# Patient Record
Sex: Female | Born: 1965 | Race: Black or African American | Hispanic: No | Smoking: Never smoker
Health system: Southern US, Community
[De-identification: ages and names within clinical notes are randomized; demographics above are authoritative.]

## PROBLEM LIST (undated history)

## (undated) DIAGNOSIS — E785 Hyperlipidemia, unspecified: Secondary | ICD-10-CM

## (undated) DIAGNOSIS — I10 Essential (primary) hypertension: Secondary | ICD-10-CM

## (undated) HISTORY — PX: NO PAST SURGERIES: SHX2092

---

## 1998-01-14 ENCOUNTER — Other Ambulatory Visit: Admission: RE | Admit: 1998-01-14 | Discharge: 1998-01-14 | Payer: Self-pay | Admitting: Obstetrics and Gynecology

## 1998-04-18 ENCOUNTER — Inpatient Hospital Stay (HOSPITAL_COMMUNITY): Admission: AD | Admit: 1998-04-18 | Discharge: 1998-04-18 | Payer: Self-pay | Admitting: Obstetrics & Gynecology

## 1998-07-18 ENCOUNTER — Inpatient Hospital Stay (HOSPITAL_COMMUNITY): Admission: AD | Admit: 1998-07-18 | Discharge: 1998-07-18 | Payer: Self-pay | Admitting: Obstetrics and Gynecology

## 1998-07-18 ENCOUNTER — Encounter: Payer: Self-pay | Admitting: Obstetrics & Gynecology

## 1998-07-21 ENCOUNTER — Inpatient Hospital Stay (HOSPITAL_COMMUNITY): Admission: AD | Admit: 1998-07-21 | Discharge: 1998-07-25 | Payer: Self-pay | Admitting: Obstetrics & Gynecology

## 1999-01-05 ENCOUNTER — Other Ambulatory Visit: Admission: RE | Admit: 1999-01-05 | Discharge: 1999-01-05 | Payer: Self-pay | Admitting: *Deleted

## 2001-02-03 ENCOUNTER — Encounter: Payer: Self-pay | Admitting: *Deleted

## 2001-02-03 ENCOUNTER — Encounter: Admission: RE | Admit: 2001-02-03 | Discharge: 2001-02-03 | Payer: Self-pay | Admitting: *Deleted

## 2001-08-13 ENCOUNTER — Other Ambulatory Visit: Admission: RE | Admit: 2001-08-13 | Discharge: 2001-08-13 | Payer: Self-pay | Admitting: *Deleted

## 2001-08-26 ENCOUNTER — Ambulatory Visit (HOSPITAL_COMMUNITY): Admission: RE | Admit: 2001-08-26 | Discharge: 2001-08-26 | Payer: Self-pay | Admitting: *Deleted

## 2001-08-26 ENCOUNTER — Encounter: Payer: Self-pay | Admitting: *Deleted

## 2002-02-12 ENCOUNTER — Inpatient Hospital Stay (HOSPITAL_COMMUNITY): Admission: AD | Admit: 2002-02-12 | Discharge: 2002-02-14 | Payer: Self-pay | Admitting: *Deleted

## 2002-02-13 ENCOUNTER — Encounter (INDEPENDENT_AMBULATORY_CARE_PROVIDER_SITE_OTHER): Payer: Self-pay | Admitting: Specialist

## 2003-04-13 ENCOUNTER — Other Ambulatory Visit: Admission: RE | Admit: 2003-04-13 | Discharge: 2003-04-13 | Payer: Self-pay | Admitting: Obstetrics and Gynecology

## 2003-05-28 ENCOUNTER — Ambulatory Visit (HOSPITAL_COMMUNITY): Admission: RE | Admit: 2003-05-28 | Discharge: 2003-05-28 | Payer: Self-pay | Admitting: Obstetrics and Gynecology

## 2003-08-21 ENCOUNTER — Inpatient Hospital Stay (HOSPITAL_COMMUNITY): Admission: AD | Admit: 2003-08-21 | Discharge: 2003-08-21 | Payer: Self-pay | Admitting: Obstetrics and Gynecology

## 2003-09-30 ENCOUNTER — Inpatient Hospital Stay (HOSPITAL_COMMUNITY): Admission: AD | Admit: 2003-09-30 | Discharge: 2003-09-30 | Payer: Self-pay

## 2003-10-05 ENCOUNTER — Inpatient Hospital Stay (HOSPITAL_COMMUNITY): Admission: AD | Admit: 2003-10-05 | Discharge: 2003-10-07 | Payer: Self-pay | Admitting: Obstetrics and Gynecology

## 2003-10-05 ENCOUNTER — Encounter (INDEPENDENT_AMBULATORY_CARE_PROVIDER_SITE_OTHER): Payer: Self-pay | Admitting: Specialist

## 2004-01-13 ENCOUNTER — Encounter (HOSPITAL_COMMUNITY): Admission: RE | Admit: 2004-01-13 | Discharge: 2004-04-12 | Payer: Self-pay | Admitting: Cardiovascular Disease

## 2004-01-20 ENCOUNTER — Encounter: Admission: RE | Admit: 2004-01-20 | Discharge: 2004-01-20 | Payer: Self-pay | Admitting: Cardiovascular Disease

## 2004-02-01 ENCOUNTER — Encounter: Admission: RE | Admit: 2004-02-01 | Discharge: 2004-02-01 | Payer: Self-pay | Admitting: Cardiovascular Disease

## 2004-02-08 ENCOUNTER — Encounter: Admission: RE | Admit: 2004-02-08 | Discharge: 2004-02-08 | Payer: Self-pay | Admitting: Cardiovascular Disease

## 2004-02-29 ENCOUNTER — Ambulatory Visit (HOSPITAL_COMMUNITY): Admission: RE | Admit: 2004-02-29 | Discharge: 2004-02-29 | Payer: Self-pay | Admitting: General Surgery

## 2004-02-29 ENCOUNTER — Encounter (INDEPENDENT_AMBULATORY_CARE_PROVIDER_SITE_OTHER): Payer: Self-pay | Admitting: *Deleted

## 2004-05-15 ENCOUNTER — Encounter (INDEPENDENT_AMBULATORY_CARE_PROVIDER_SITE_OTHER): Payer: Self-pay | Admitting: *Deleted

## 2004-05-15 ENCOUNTER — Ambulatory Visit (HOSPITAL_COMMUNITY): Admission: RE | Admit: 2004-05-15 | Discharge: 2004-05-16 | Payer: Self-pay | Admitting: General Surgery

## 2005-03-19 ENCOUNTER — Ambulatory Visit (HOSPITAL_COMMUNITY): Admission: RE | Admit: 2005-03-19 | Discharge: 2005-03-19 | Payer: Self-pay | Admitting: *Deleted

## 2005-03-23 ENCOUNTER — Inpatient Hospital Stay (HOSPITAL_COMMUNITY): Admission: AD | Admit: 2005-03-23 | Discharge: 2005-03-23 | Payer: Self-pay | Admitting: *Deleted

## 2005-04-23 ENCOUNTER — Encounter: Admission: RE | Admit: 2005-04-23 | Discharge: 2005-04-23 | Payer: Self-pay | Admitting: Cardiovascular Disease

## 2006-07-20 IMAGING — CR DG CHEST 2V
2 series · 2 of 2 positions shown · non-contrast
Comparison: none

CLINICAL DATA: Thyroid nodule.  Weakness and tiredness.
 PA AND LATERAL CHEST 
 Heart size and vascularity are normal and the lungs are clear.  There is a very mild thoracolumbar scoliosis.  No significant bony abnormality. 
 IMPRESSION
 No significant abnormality.

[view not recorded (1 of 2)]
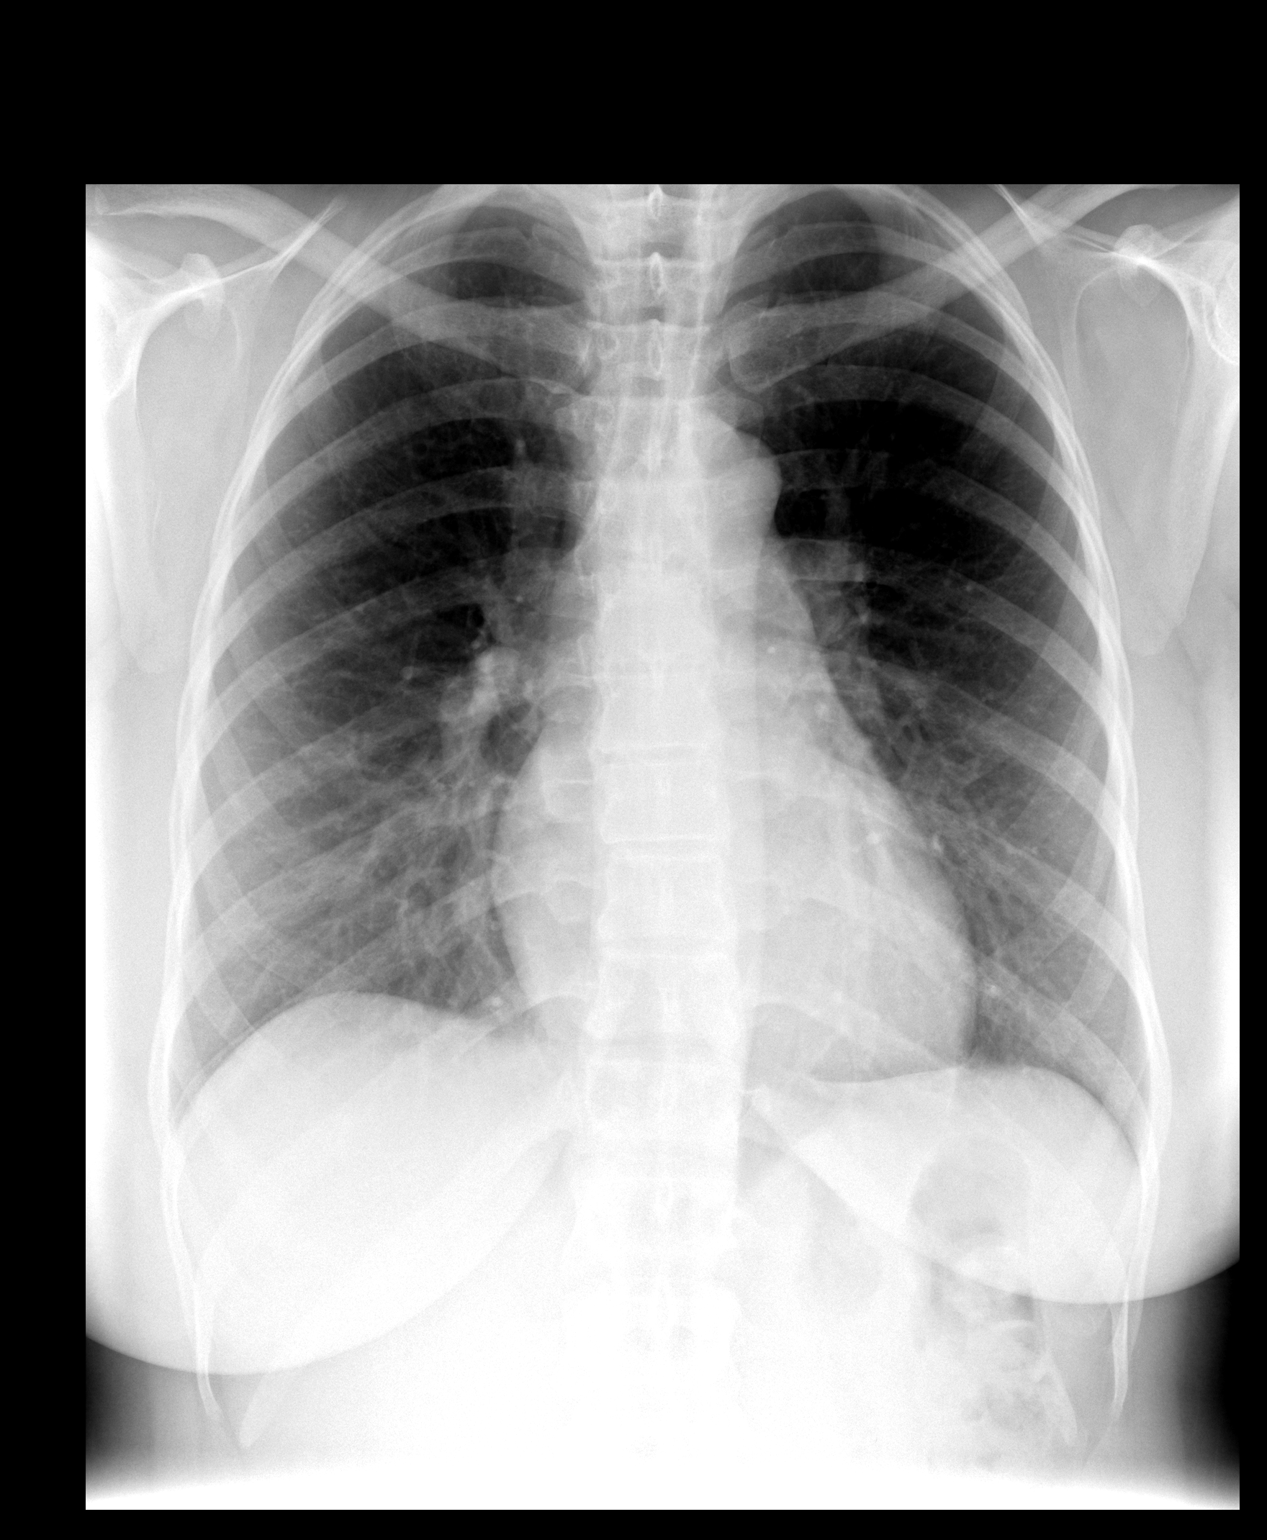

[view not recorded (2 of 2)]
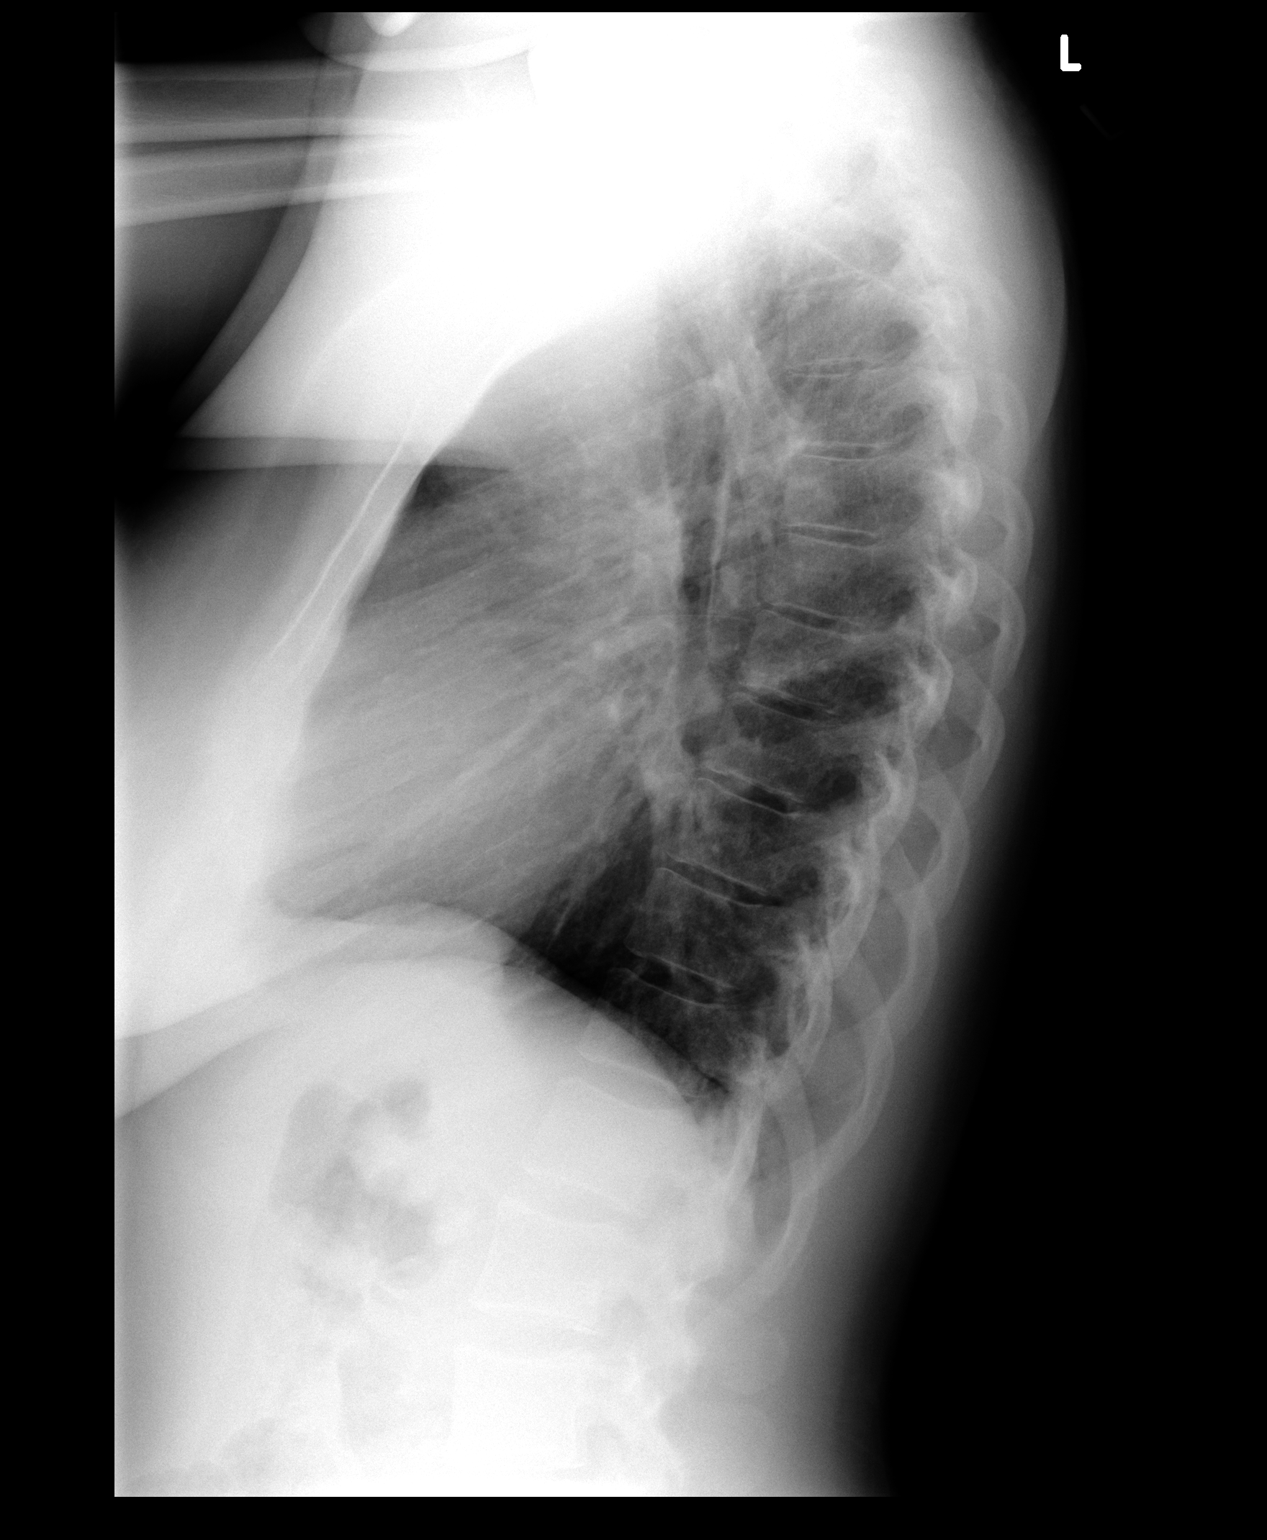

[2 of 2 positions shown; findings below may reference images not displayed]

## 2006-07-20 IMAGING — US US SOFT TISSUE HEAD/NECK
1 series · 14 of 25 positions shown · non-contrast
Comparison: none

CLINICAL DATA: Weakness.  Fatigue.  Followup thyroid nodules.  
THYROID ULTRASOUND

[Series 1: unknown · 0.07mm/px · 14 of 40 slices shown]
[im 1/40]
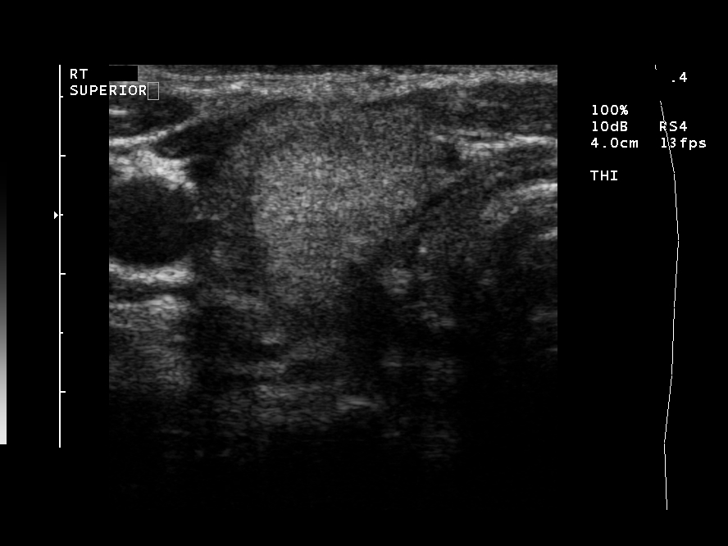
[im 4/40]
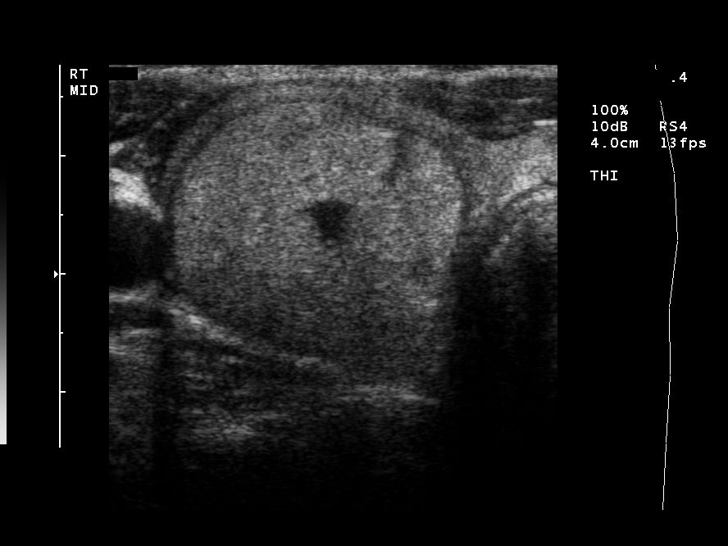
[im 7/40]
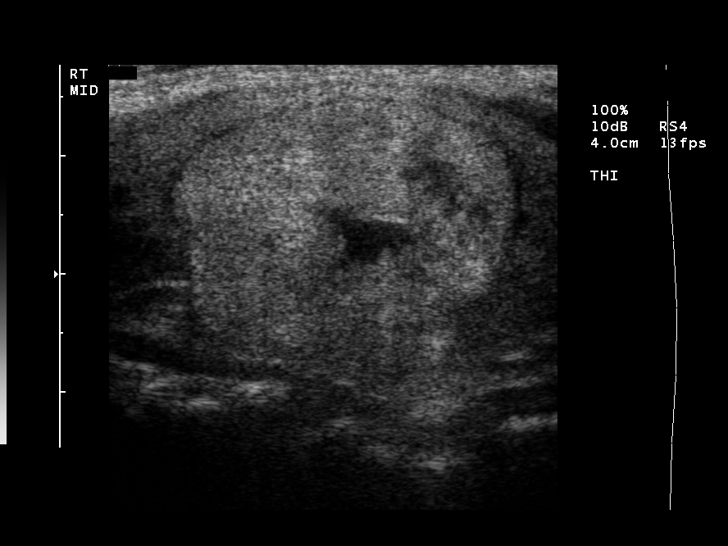
[im 10/40]
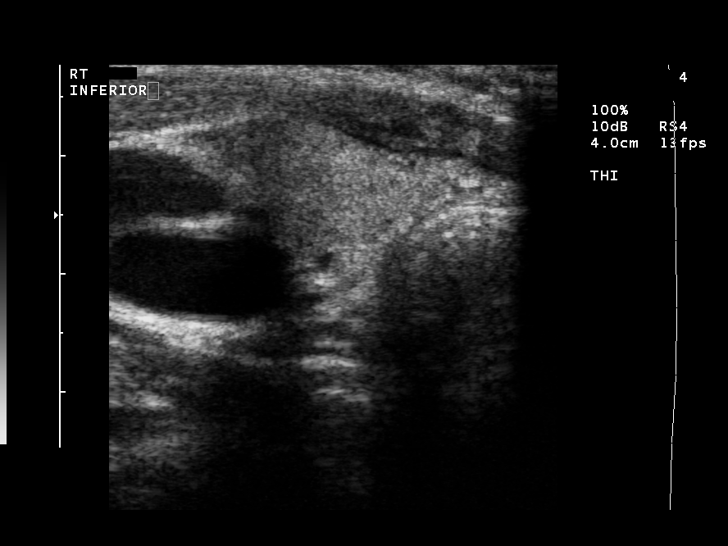
[im 14/40]
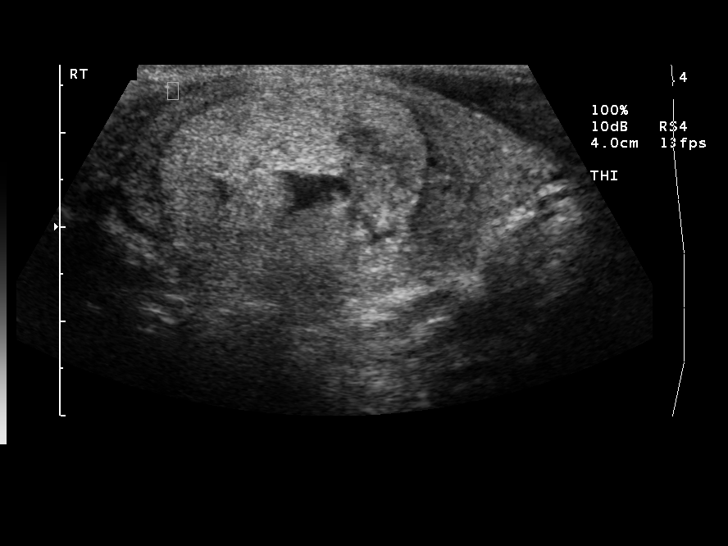
[im 15/40]
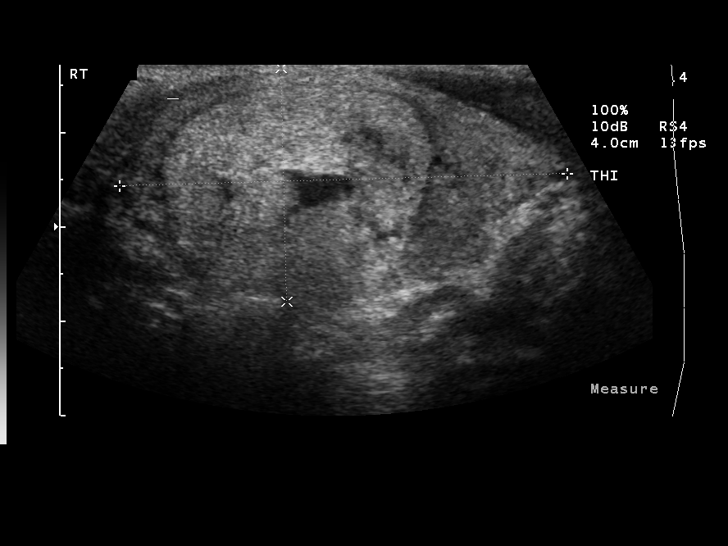
[im 18/40]
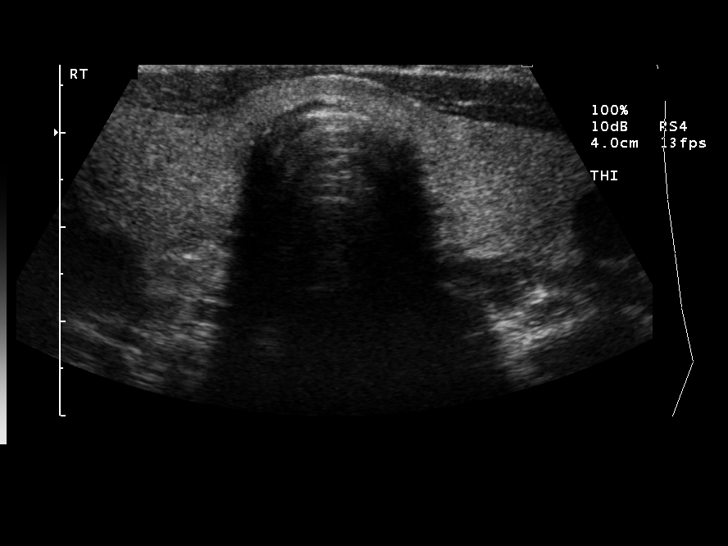
[im 22/40]
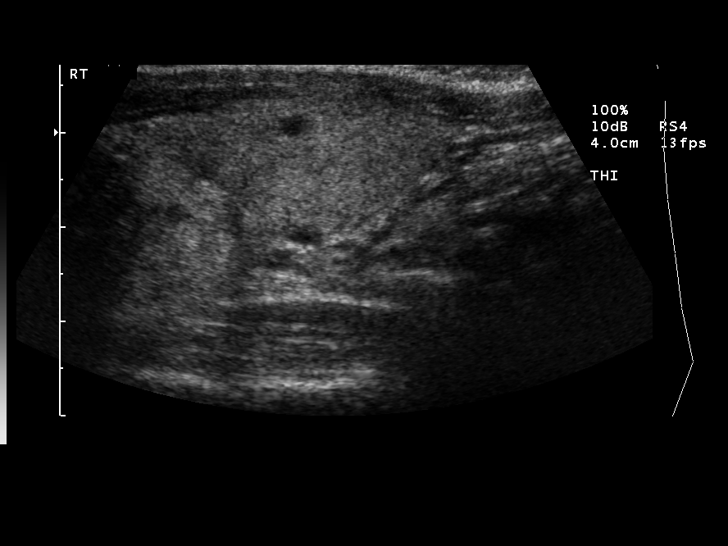
[im 25/40]
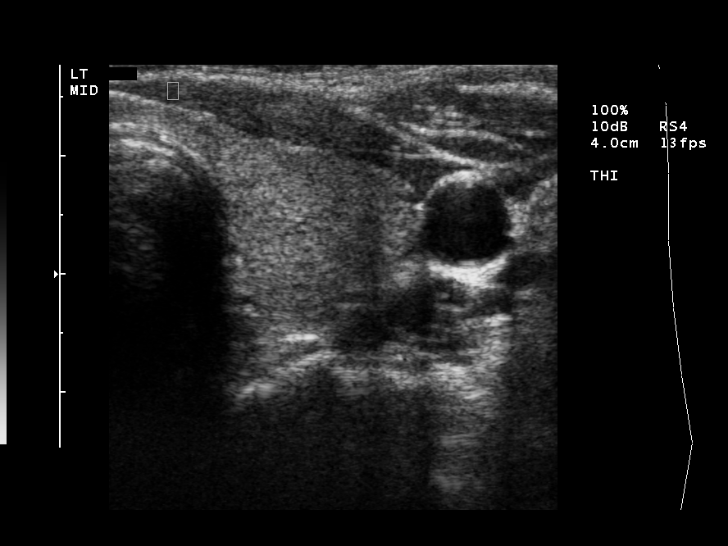
[im 27/40]
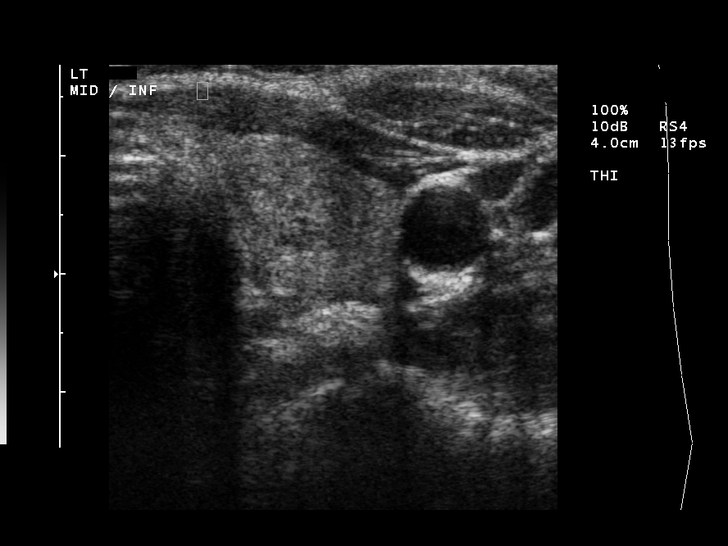
[im 30/40]
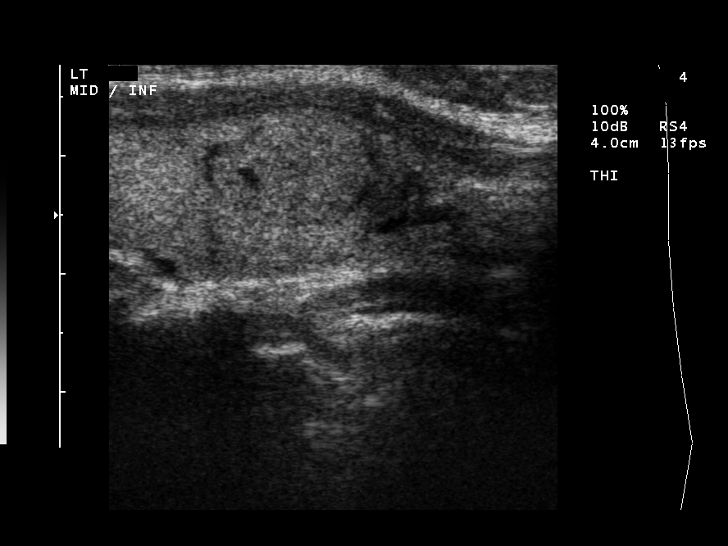
[im 33/40]
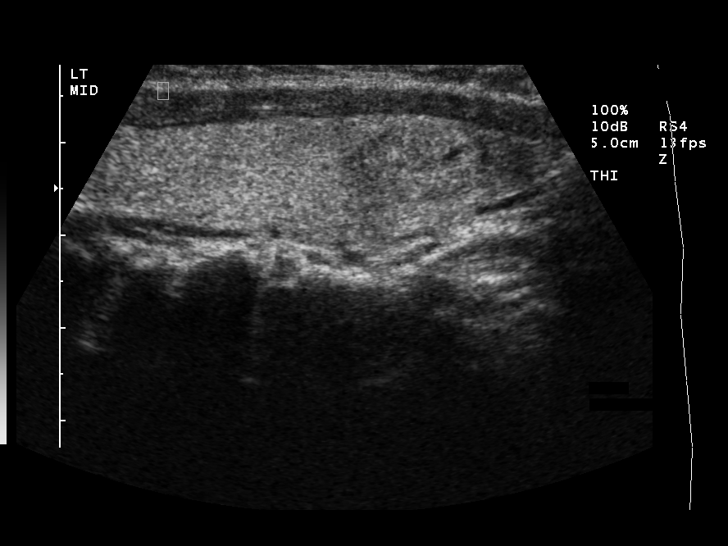
[im 36/40]
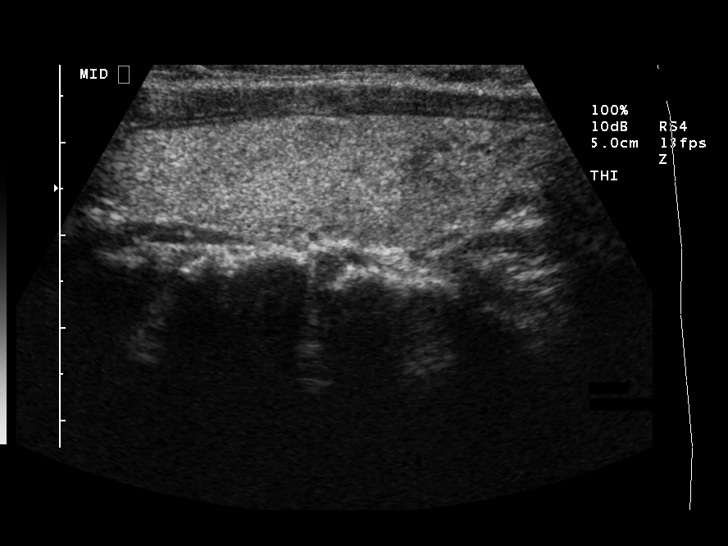
[im 40/40]
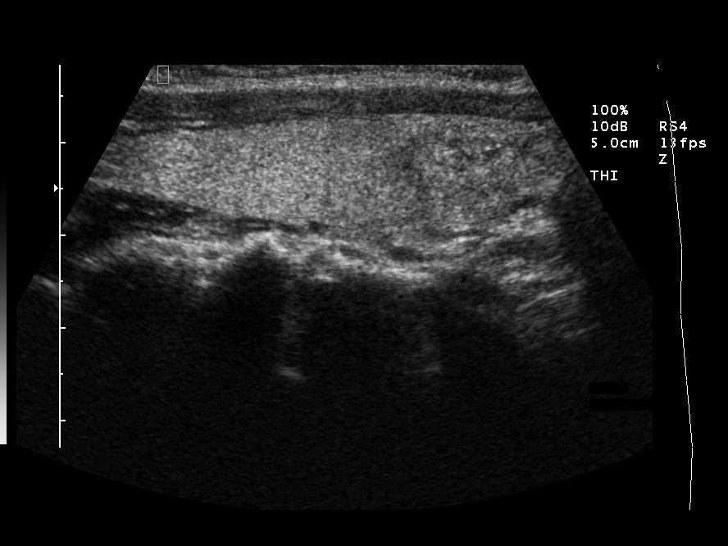

[14 of 25 positions shown; findings below may reference images not displayed]

FINDINGS: Comparison is made with [REDACTED] thyroid scan uptake 01/14/04.  The right lobe thyroid is slightly enlarged measuring 4.7 cm long x 2.4 cm AP x 2.7 cm wide with the left lobe upper limits of normal at 5.3 cm long x 1.2 cm AP x 1.6 cm wide with the isthmus measuring normally at 3 mm AP.  In region of cold defect on nuclear medicine scan [REDACTED] 01/14/04 is a marginated slightly hyperechoic nodule with central cystic degeneration.  This nodule measures 2.9 cm long x 2.3 cm AP x 2.5 cm wide.  Due to cold nature on nuclear medicine scan, needle biopsy is advised for further evaluation.  The mid to inferior left lobe demonstrates a solid nodule measuring 1.5 cm long x 0.9 cm AP x 1 cm wide.  The right paramedian isthmus demonstrates a small benign appearing 3 mm cyst (probable degenerated adenoma). 
IMPRESSION
Probable three adenomas within the thyroid gland although due to cold defect on thyroid scan [REDACTED] 01/14/04, the largest right lobe nodule biopsy is advised for further confirmation.

## 2007-06-04 ENCOUNTER — Ambulatory Visit (HOSPITAL_COMMUNITY): Admission: RE | Admit: 2007-06-04 | Discharge: 2007-06-04 | Payer: Self-pay | Admitting: Obstetrics & Gynecology

## 2007-07-16 ENCOUNTER — Ambulatory Visit (HOSPITAL_COMMUNITY): Admission: RE | Admit: 2007-07-16 | Discharge: 2007-07-16 | Payer: Self-pay | Admitting: Obstetrics & Gynecology

## 2007-08-15 ENCOUNTER — Ambulatory Visit (HOSPITAL_COMMUNITY): Admission: RE | Admit: 2007-08-15 | Discharge: 2007-08-15 | Payer: Self-pay | Admitting: Obstetrics & Gynecology

## 2007-09-04 ENCOUNTER — Inpatient Hospital Stay (HOSPITAL_COMMUNITY): Admission: RE | Admit: 2007-09-04 | Discharge: 2007-09-07 | Payer: Self-pay | Admitting: Obstetrics & Gynecology

## 2007-09-04 ENCOUNTER — Encounter: Payer: Self-pay | Admitting: Obstetrics & Gynecology

## 2007-09-17 ENCOUNTER — Ambulatory Visit: Payer: Self-pay | Admitting: Internal Medicine

## 2007-09-17 ENCOUNTER — Encounter: Payer: Self-pay | Admitting: Obstetrics

## 2007-09-17 ENCOUNTER — Inpatient Hospital Stay (HOSPITAL_COMMUNITY): Admission: AD | Admit: 2007-09-17 | Discharge: 2007-09-22 | Payer: Self-pay | Admitting: Psychiatry

## 2007-09-17 IMAGING — US US PELVIS COMPLETE MODIFY
1 series · 13 of 25 positions shown · non-contrast
Comparison: none

CLINICAL DATA: Questionable fibroids.  Bleeding after LMP for two weeks.  LMP 02/26/05.
TRANSABDOMINAL AND ENDOVAGINAL PELVIC ULTRASOUND:

[Series 1: us pelvis complete modify · 0.35mm/px · 13 of 60 slices shown]
[im 1/60]
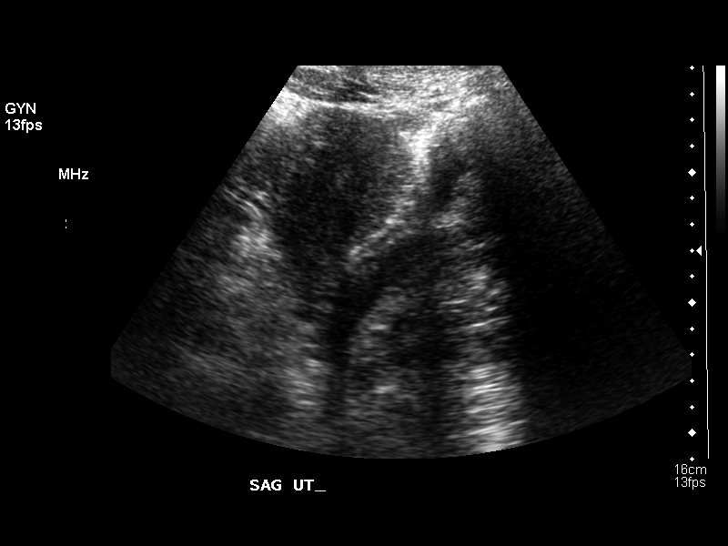
[im 5/60]
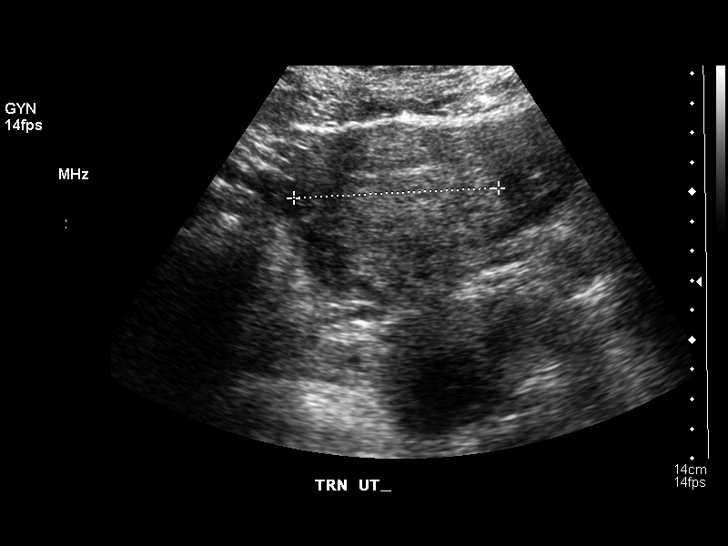
[im 10/60]
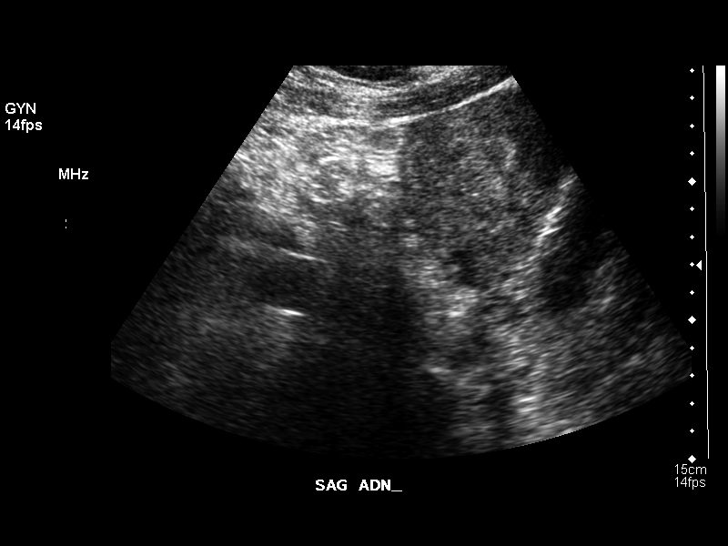
[im 15/60]
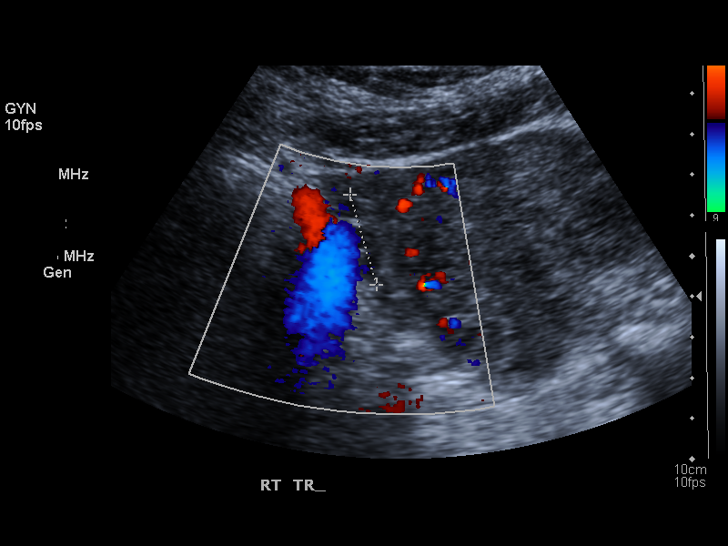
[im 20/60]
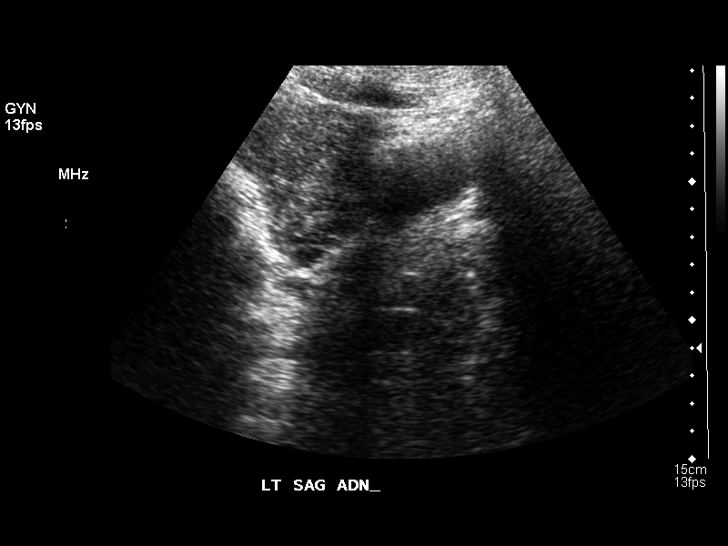
[im 25/60]
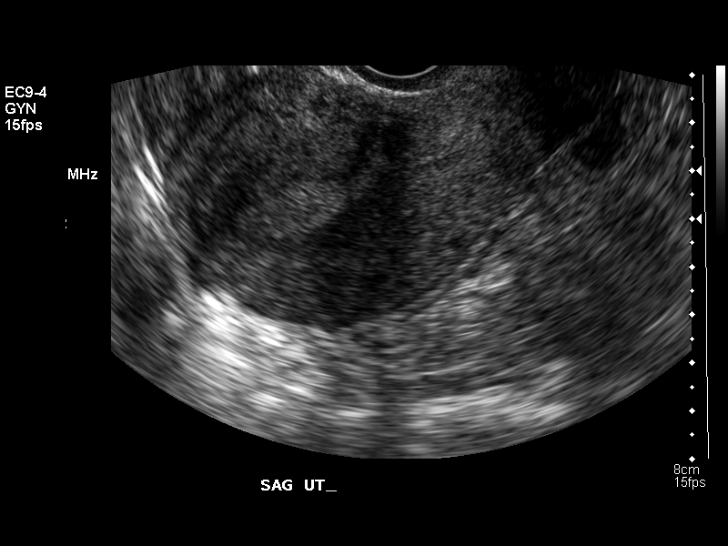
[im 30/60]
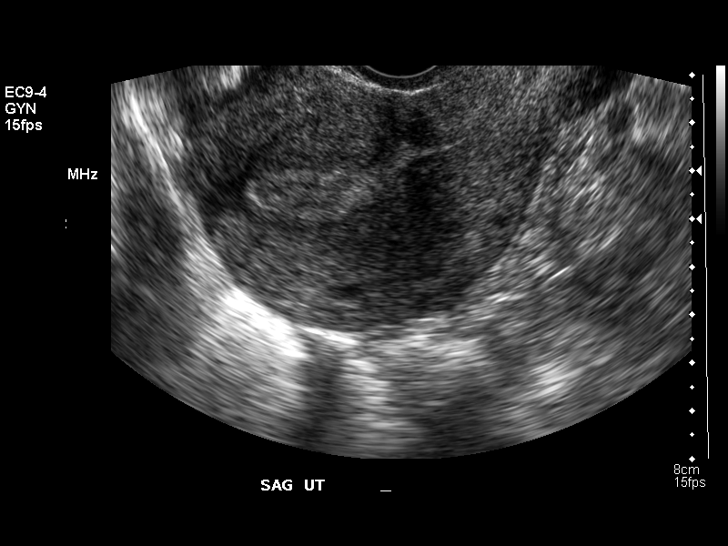
[im 35/60]
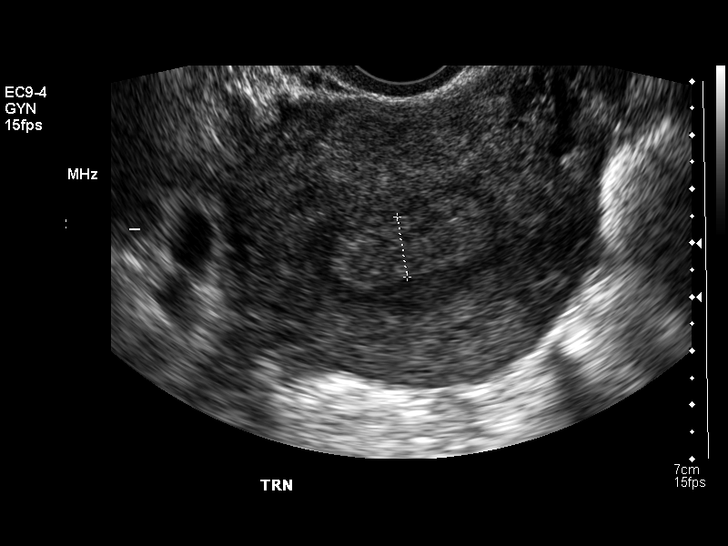
[im 40/60]
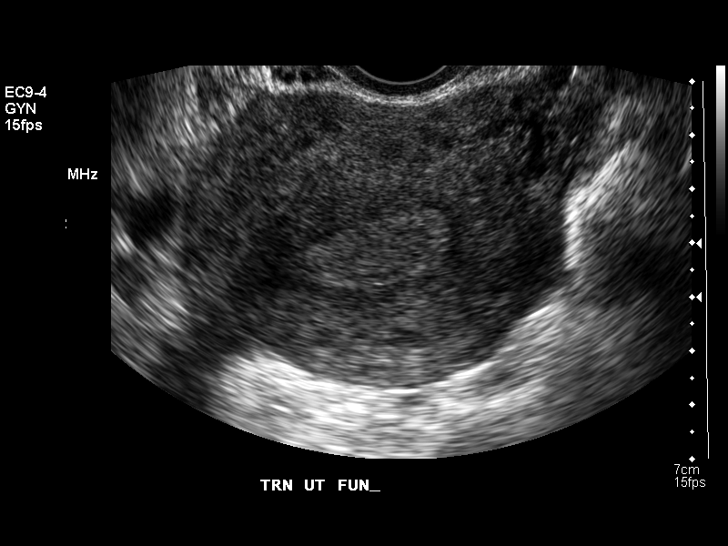
[im 45/60]
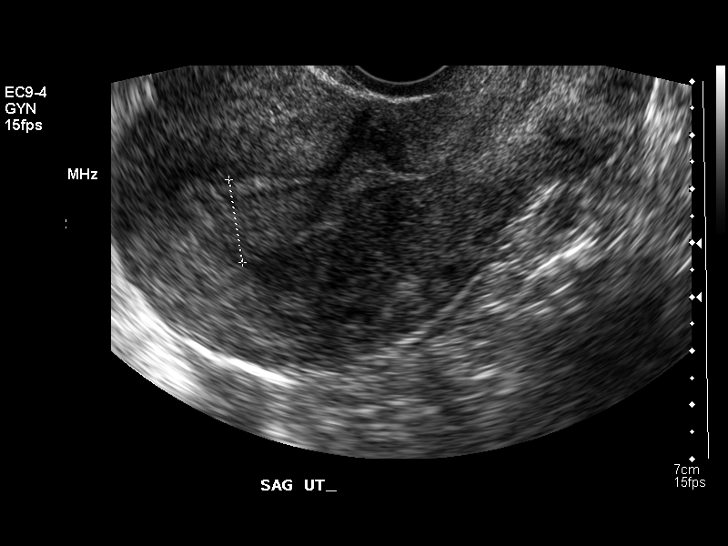
[im 50/60]
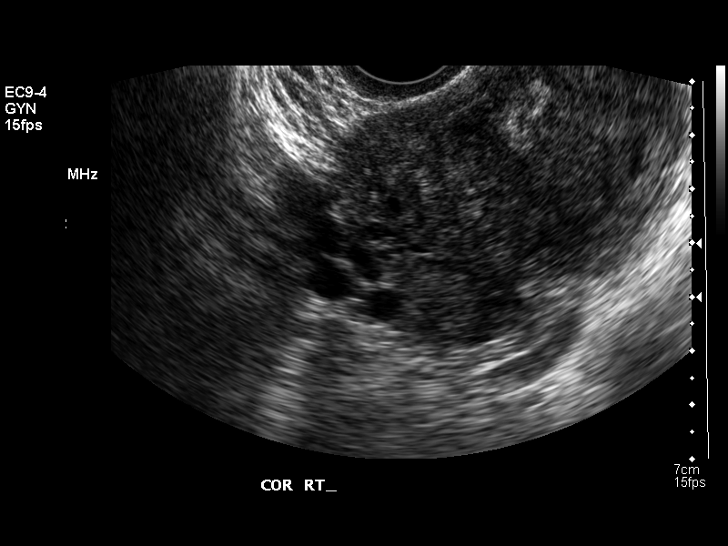
[im 55/60]
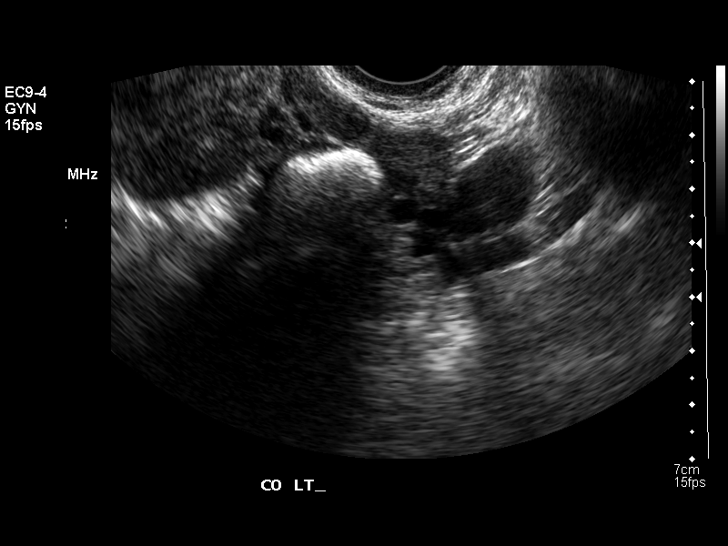
[im 60/60]
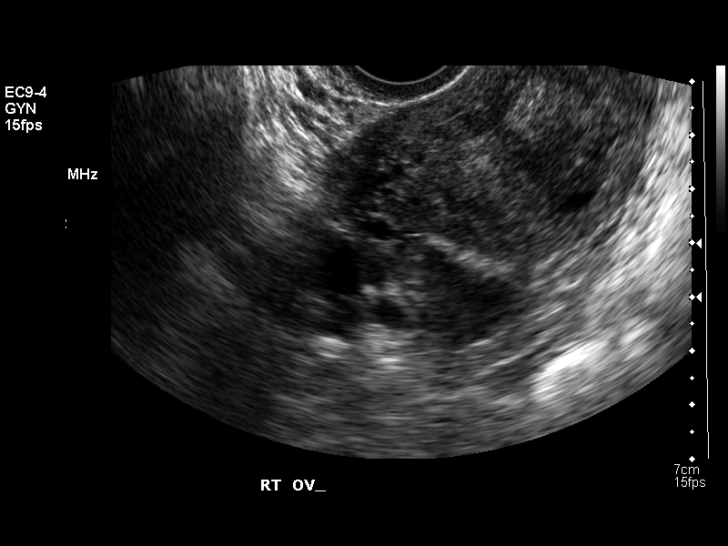

[13 of 25 positions shown; findings below may reference images not displayed]

FINDINGS: Multiple images of the uterus and adnexa were obtained using a transabdominal and endovaginal approaches. 
The uterus has a sagittal length of 9.6 cm, AP width of 5.5 cm and a transverse width of 6.9 cm.  The uterine myometrium appears homogeneous.  The endometrial stripe is slightly thick with an AP width of 1.1 cm.  There is some central hypo-echogenicity identified suggesting the possibility of some intraluminal blood.  Also noted is an area of decreased echogenicity in the region of the external cervical os suggesting the presence of some blood within the endocervical canal.  Noted is the presence of a c-section scar with thinning of the myometrium in this location and a triangular extension of endometrium into the location of the c-section defect.  
Both ovaries are seen with the right ovary measuring 2.3 x 2.2 x 1.7 cm and the left ovary measuring 2.2 x 1.2 x 1.2 cm.  No cul-de-sac or periovarian fluid is seen and no separate adnexal masses are noted.
IMPRESSION: 1.  Slightly thickened endometrial canal with a suggestion of some intraluminal blood.  A question of some blood in the region of the external cervical os is also noted.  Findings are compatible with a uterine defect in the region of the patient?s prior c-section with endometrium extending up into the site of the defect.  This can be further assessed with sonohysterography if felt clinically indicated.
2.  Normal ovaries.  
3.  No focal myometrial abnormalities noted.

## 2007-09-18 ENCOUNTER — Encounter: Payer: Self-pay | Admitting: Obstetrics & Gynecology

## 2007-09-18 ENCOUNTER — Ambulatory Visit: Payer: Self-pay | Admitting: Oncology

## 2007-11-18 ENCOUNTER — Ambulatory Visit: Payer: Self-pay | Admitting: Oncology

## 2008-02-08 ENCOUNTER — Emergency Department (HOSPITAL_COMMUNITY): Admission: EM | Admit: 2008-02-08 | Discharge: 2008-02-08 | Payer: Self-pay | Admitting: Emergency Medicine

## 2008-02-12 ENCOUNTER — Encounter: Admission: RE | Admit: 2008-02-12 | Discharge: 2008-02-12 | Payer: Self-pay | Admitting: Obstetrics & Gynecology

## 2008-02-24 ENCOUNTER — Encounter: Admission: RE | Admit: 2008-02-24 | Discharge: 2008-02-24 | Payer: Self-pay | Admitting: Obstetrics & Gynecology

## 2008-08-24 ENCOUNTER — Ambulatory Visit (HOSPITAL_COMMUNITY): Admission: RE | Admit: 2008-08-24 | Discharge: 2008-08-24 | Payer: Self-pay | Admitting: Obstetrics & Gynecology

## 2009-02-17 ENCOUNTER — Ambulatory Visit (HOSPITAL_COMMUNITY): Admission: RE | Admit: 2009-02-17 | Discharge: 2009-02-17 | Payer: Self-pay | Admitting: Obstetrics & Gynecology

## 2009-02-17 ENCOUNTER — Ambulatory Visit: Admission: RE | Admit: 2009-02-17 | Discharge: 2009-02-17 | Payer: Self-pay | Admitting: Gynecologic Oncology

## 2009-03-01 ENCOUNTER — Encounter: Admission: RE | Admit: 2009-03-01 | Discharge: 2009-03-01 | Payer: Self-pay | Admitting: Obstetrics & Gynecology

## 2009-03-29 ENCOUNTER — Encounter: Payer: Self-pay | Admitting: Obstetrics & Gynecology

## 2009-03-29 ENCOUNTER — Inpatient Hospital Stay (HOSPITAL_COMMUNITY): Admission: RE | Admit: 2009-03-29 | Discharge: 2009-04-01 | Payer: Self-pay | Admitting: Obstetrics & Gynecology

## 2009-04-16 ENCOUNTER — Emergency Department (HOSPITAL_COMMUNITY): Admission: EM | Admit: 2009-04-16 | Discharge: 2009-04-16 | Payer: Self-pay | Admitting: Emergency Medicine

## 2010-04-30 ENCOUNTER — Encounter: Payer: Self-pay | Admitting: Obstetrics & Gynecology

## 2010-06-25 LAB — WOUND CULTURE

## 2010-07-10 LAB — COMPREHENSIVE METABOLIC PANEL
ALT: 12 U/L (ref 0–35)
AST: 16 U/L (ref 0–37)
Albumin: 4.1 g/dL (ref 3.5–5.2)
CO2: 28 mEq/L (ref 19–32)
Calcium: 9.2 mg/dL (ref 8.4–10.5)
Chloride: 105 mEq/L (ref 96–112)
GFR calc Af Amer: >60 mL/min (ref >60)
GFR calc non Af Amer: >60 mL/min (ref >60)
Sodium: 139 mEq/L (ref 135–145)

## 2010-07-10 LAB — BASIC METABOLIC PANEL
CO2: 24 mEq/L (ref 19–32)
Chloride: 101 mEq/L (ref 96–112)
GFR calc Af Amer: >60 mL/min (ref >60)
Potassium: 3.5 mEq/L (ref 3.5–5.1)
Sodium: 133 mEq/L — ABNORMAL LOW (ref 135–145)

## 2010-07-10 LAB — CBC
HCT: 31 % — ABNORMAL LOW (ref 36.0–46.0)
Hemoglobin: 10.5 g/dL — ABNORMAL LOW (ref 12.0–15.0)
MCHC: 33.5 g/dL (ref 30.0–36.0)
MCHC: 34 g/dL (ref 30.0–36.0)
MCV: 80.8 fL (ref 78.0–100.0)
Platelets: 327 10*3/uL (ref 150–400)
RBC: 3.84 MIL/uL — ABNORMAL LOW (ref 3.87–5.11)
RBC: 4.53 MIL/uL (ref 3.87–5.11)
WBC: 13.7 10*3/uL — ABNORMAL HIGH (ref 4.0–10.5)
WBC: 7.6 10*3/uL (ref 4.0–10.5)

## 2010-07-10 LAB — TYPE AND SCREEN: ABO/RH(D): B POS

## 2010-07-10 LAB — DIFFERENTIAL
Eosinophils Absolute: 0.1 10*3/uL (ref 0.0–0.7)
Eosinophils Relative: 2 % (ref 0–5)
Lymphs Abs: 2.2 10*3/uL (ref 0.7–4.0)
Monocytes Absolute: 0.5 10*3/uL (ref 0.1–1.0)

## 2010-07-10 LAB — ABO/RH: ABO/RH(D): B POS

## 2010-08-22 NOTE — Op Note (Signed)
NAME:  Nicole Lyons, Nicole Lyons             ACCOUNT NO.:  [id number]   MEDICAL RECORD NO.:  [id number]          PATIENT TYPE:  OBV   LOCATION:  9374                          FACILITY:  [facility]   PHYSICIAN:  Charles A. Clearance Coots, M.D.DATE OF BIRTH:  05-04-1965   DATE OF PROCEDURE:  DATE OF DISCHARGE:                               OPERATIVE REPORT   PREOPERATIVE DIAGNOSES:  Hematometra and retained products of conception  by ultrasound status post cesarean section for placenta previa.   POSTOPERATIVE DIAGNOSES:  Hematometra and retained products of  conception by ultrasound status post cesarean section for placenta  previa.   PROCEDURE:  Suction evacuation and insertion of Bakri balloon tamponade  catheter.   SURGEON:  Charles A. Clearance Coots, MD   ANESTHESIA:  MAC with paracervical block.   ESTIMATED BLOOD LOSS:  Approximately 600 mL.   SPECIMEN:  Approximately 3000 mL of clot and small amount of tissue.   DISPOSITION:  Specimen to pathology.   FINDINGS:  Large amount of clot within uterine cavity and small amount  of tissue.   OPERATION:  The patient was brought to the operating room and after  satisfactory IV sedation, legs brought up in stirrups and the vagina was  prepped and draped in a usual sterile fashion.  A Foley catheter was  inserted in the urinary bladder to gravity drainage.  Sterile speculum  was inserted in the vaginal vault.  Cervix was isolated.  Anterior lip  of the cervix was grasped with a single-tooth tenaculum.  Paracervical  block of 1% Xylocaine approximately 20 mL with 10 mL in each lateral  fornix was injected without complications.  Cervix was dilated and a 14  mm suction cath was easily introduced into the uterine cavity and all  contents were evacuated.  After evacuating all uterine contents, small  amount of tissue was noted within the suction catheter and tube.  The  procedure was done under ultrasound guidance and at the conclusion of  the procedure, the  ultrasound tech indicated that the uterine cavity was  emptied and the endometrial stripe was thin.  The Bakri balloon  tamponade was then inserted within the uterine cavity, passed the  internal os, and was inflated with 300 mL of sterile water and the  catheter was secured on tension to the inner thigh area.  All  instruments were then retired.  There was no active bleeding at the  conclusion of the procedure.  The  patient tolerated the procedure well  and was transported to the recovery room in satisfactory condition.      Charles A. Clearance Coots, M.D.  Electronically Signed     CAH/MEDQ  D:  09/17/2007  T:  09/18/2007  Job:  [id number]

## 2010-08-22 NOTE — H&P (Signed)
NAME:  Nicole Lyons, Nicole Lyons NO.:  [id number]   MEDICAL RECORD NO.:  [id number]           PATIENT TYPE:   LOCATION:                                 FACILITY:   PHYSICIAN:  Roseanna Rainbow, M.D.DATE OF BIRTH:  03/05/1966   DATE OF ADMISSION:  DATE OF DISCHARGE:                              HISTORY & PHYSICAL   CHIEF COMPLAINT:  The patient is a 45 year old para 2 with an estimated  date of confinement of June 8 with an intrauterine pregnancy at 38 plus  weeks with a placenta previa for an elective repeat cesarean delivery.   HISTORY OF PRESENT ILLNESS:  Serial ultrasounds have demonstrated a  persistent posterior placenta previa.  An ultrasound on May 8 showed a  normal amniotic fluid index, posterior pacenta previa.  The estimated  fetal weight percentile was at the 68th percentile.   MEDICATIONS:  Please see the medication reconciliation forms.   ALLERGIES:  Questionable MONISTAT, VAGISIL.   OB RISK FACTORS:  There is a history of a previous fetal demise.  History of a previous cesarean delivery, advanced maternal age, and a  complete placenta previa with this pregnancy.   PAST OBSTETRICAL HISTORY:  In April 2000, she has delivered a live born  female full term, 8 pounds 5 ounces.  She had cesarean delivery in  November 2003.  There was a term IUFD.  In June 2005, there was a repeat  cesarean delivery.  She has delivered a live born female, 8 pounds 6  ounces.  Prenatal labs, blood type B+, antibody screen negative,  chlamydia probe negative.  Urine culture and sensitivity, no uro  pathogens.  Pap smear negative.  GC probe negative.  One-hour GCT 122.  Hepatitis B surface antigen negative.  Hematocrit 27.8, hemoglobin 9.6,  HIV nonreactive, and platelets 259,000.  RPR nonreactive.  Rubella  immune.  Sickle cell negative.  Varicella immune.   PAST GYN HISTORY:  D&C.   PAST MEDICAL HISTORY:  Questionable history of thyroid dysfunction.   PAST SURGICAL  HISTORY:  Please see the above.   SOCIAL HISTORY:  She is unemployed, engaged, living with the significant  other, does not give any significant history of alcohol usage.  Has no  significant smoking history.  Denies illicit drug use.   FAMILY HISTORY:  High blood pressure.   PHYSICAL EXAMINATION:  VITAL SIGNS:  Stable, afebrile.  Doppler fetal  heart tones 140.  GENERAL:  Well-developed, well-nourished, in no apparent distress.  LUNGS:  Clear to auscultation bilaterally.  HEART:  Regular rate and rhythm.  ABDOMEN:  Gravid.  PELVIC:  Deferred.   ASSESSMENT:  Multiparous patient at term with a history of multiple  cesarean deliveries, now with a posterior complete placenta previa.  Desires a sterilization procedure.   PLAN:  Admission.  Repeat cesarean delivery and bilateral tubal  ligation.  The risks, benefits, and alternative forms of management were  reviewed with the patient and informed consent had been obtained.      Roseanna Rainbow, M.D.  Electronically Signed     LAJ/MEDQ  D:  09/03/2007  T:  09/04/2007  Job:  [id number]

## 2010-08-22 NOTE — Consult Note (Signed)
NAME:  Nicole Lyons, Nicole Lyons             ACCOUNT NO.:  [id number]   MEDICAL RECORD NO.:  [id number]          PATIENT TYPE:  INP   LOCATION:  9303                          FACILITY:  [facility]   PHYSICIAN:  Firas N. Shadad        DATE OF BIRTH:  10/22/1965   DATE OF CONSULTATION:  09/18/2007  DATE OF DISCHARGE:                                 CONSULTATION   REASON FOR CONSULT:  DIC.   REFERRING PHYSICIAN:  Dr. Clearance Coots   HISTORY OF PRESENT ILLNESS:  Ms. Nicole Lyons is a pleasant 45 year old  African American female asked to see for evaluation of DIC.  The patient  is 2 weeks postpartum status post C-section for placenta previa who  presented on September 17, 2007, with a 2-day history of heavy vaginal  bleeding and cramping, as well as loss of consciousness as she attempted  ambulation.  While the platelet count was 199,000 on May 29, on  admission her platelet count was 83,000; dropping to 42 then to 44,000  requiring 2 units of fresh frozen plasma; cryoprecipitate today, 6  units.  Her PT 16.3, INR 1.3, PTT 30, fibrinogen 157.  In addition, she  has severe anemia.  On admission her H&H was 4.5 and 12.8 respectively,  requiring 8 units in total of packed rbc's since then, with the current  hemoglobin and hematocrit of 5.9 and 16.5 respectively.  She had an  emergent procedure on June 10 for suction and evacuation of retained  products of conception with a large amount of clots in the uterus, with  an estimated blood loss of 600 mL and a clot formation with anterior  tissue wall material estimated at 3000 mL.  The patient underwent  hypovolemic shock.  At this time, the bleeding has improved slightly.  No heparin has been received.  We were asked to see her regarding DIC.  Her smear is not showing schistocytes.   PAST MEDICAL HISTORY:  Questionable history of thyroid dysfunction,  secondary to a follicular lesion on the right, nondiagnostic for  malignancy per biopsy report.   SURGERIES:  1. Status  post reduction and evacuation of retained products of      conception on September 17, 2007, with a large amount of clot in uterus      found.  2. Status C-section Sep 04, 2007, Dr. Tamela Oddi, secondary to      placenta previa.  3. Status post C-section in November 2005.  4. Status post D&C in 2008.   ALLERGIES:  To MONISTAT, VAGISIL.   MEDICATIONS:  1. Mefoxin 2 grams IV q.6 h.  2. Nu-Iron 150 mg b.i.d.  3. Prenatal vitamin daily.  4. Stadol 1 mg IV q.4 h. p.r.n.  5. IV fluids at 200 mL an hour of D-5-lactated Ringer's.  6. Dilaudid 2 mg q.6 h. p.r.n.  7. Zofran 4 mg IV q.6 h.  8. Percocet one or two p.o. q.4 h. p.r.n.  9. Phenergan 12.5 mg q.4 h. p.r.n. IV.  10.Ambien 10 mg p.o. q.h.s.   REVIEW OF SYSTEMS:  Remarkable for low-back pain, fatigue and weakness.  She  has loss of consciousness prior to admission, possibly secondary to  hypovolemic shock.  All the rest of the review of systems is negative.  She denies any epistaxis, gum bleeds or joint pain.  No respiratory or  cardiac symptoms.  No headaches.   FAMILY HISTORY:  Noncontributory for hematological disorders.   SOCIAL HISTORY:  The patient is engaged.  She has two children.  No  tobacco or alcohol history.  Unemployed.  Lives in [place].   PHYSICAL EXAMINATION:  This is a [name] in  no acute distress, alert and oriented x3.  Blood pressure 76/44, pulse  99, respirations 20, temperature 98.9, pulse oximetry 97% in room air.  HEENT:  Normocephalic, atraumatic.  PERRL.  Oral mucosa without thrush  or lesions.  Sclerae anicteric.  NECK:  Supple.  No cervical or supraclavicular masses.  LUNGS:  Clear to auscultation bilaterally.  No axillary masses.  CARDIOVASCULAR:  Regular rate and rhythm without murmurs, rubs or  gallops.  ABDOMEN:  Soft, nontender.  Bowel sounds x4.  No hepatosplenomegaly.  EXTREMITIES:  With no clubbing or cyanosis.  No edema.  No inguinal  masses.  There is the  presence of PAS hose.  SKIN:  Without bruising or petechial rash.  BREASTS:  Not examined.  GU and RECTAL:  Deferred.  MUSCULOSKELETAL:  With no spinal tenderness.  NEURO:  Nonfocal.   LABORATORIES:  Hemoglobin 5.9, hematocrit 16.5, white count 9.4,  platelets 44.  Fibrinogen 157, INR 1.3, PTT 30, PT 16.3.  Sodium 136,  potassium 3.3, BUN 22, creatinine 1.9, glucose 102, total bilirubin 0.9,  alkaline phosphatase 54, AST 20, ALT 12, total protein 4.3, albumin 4.3,  calcium 6.2.  Urinalysis positive for 30 of protein, small bili,  negative nitrite, trace of blood.  RPR is negative.   ASSESSMENT AND PLAN:  Dr. Clelia Croft has seen and evaluated the patient and  reviewed the chart.  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  Hopefully, all these post pregnancy  events are to be resolved and corrected.  Thus, the recommendations are  supportive transfusion with packed rbc's, platelets and cryoprecipitate.  The patient will need  to check her CBC, DIC twice daily.  In addition, the LDH, haptoglobin  and retic count is currently pending.  All these results will be a  evaluated and further recommendations are to proceed.   Thank you very much for allowing [place] the opportunity to participate in  the care of this nice patient.      Marlowe Kays, P.A.      Blenda Nicely. [facility]  Electronically Signed    SW/MEDQ  D:  09/21/2007  T:  09/21/2007  Job:  [id number]   cc:   Leonette Most A. Clearance Coots, M.D.  Roseanna Rainbow, M.D.

## 2010-08-22 NOTE — Discharge Summary (Signed)
NAME:  Nicole Lyons, Nicole Lyons             ACCOUNT NO.:  [id number]   MEDICAL RECORD NO.:  [id number]          PATIENT TYPE:  INP   LOCATION:  9303                          [facility]ACILITY:  [facility]   PHYSICIAN:  Charles A. Clearance Coots, M.D.DATE OF BIRTH:  October 25, 1965   DATE OF ADMISSION:  09/17/2007  DATE OF DISCHARGE:  09/22/2007                               DISCHARGE SUMMARY   ADMITTING DIAGNOSIS:  Hematometra and retained products of conception  after cesarean section delivery.   DISCHARGE DIAGNOSES:  1. Hematometra and retained products of conception after cesarean      section delivery.  2. Status post suction evacuation and insertion of Bakri balloon      tamponade catheter.  3. Status post total abdominal hysterectomy for failed suction      evacuation and Bakri balloon catheter tamponade for hematometra and      retained products of conception.   The patient discharged home in good condition.   REASON FOR ADMISSION:  A 45 year old black female, 2 weeks postpartum  status post repeat cesarean section delivery for placenta previa.  The  patient presented after a 2-day history of heavy vaginal bleeding and  cramping and in last 24 hours prior to presentation the patient was  passing clots and lost consciousness when attempted ambulation.   PAST MEDICAL HISTORY:  Surgery:  Cesarean section x2.  D&C:  Partial thyroidectomy.  Illnesses:  hyperthyroidism.   MEDICATIONS:  Prenatal vitamins.   ALLERGIES:  MONISTAT and VAGISIL.   SOCIAL HISTORY:  Single.  Negative tobacco, alcohol or recreational drug  use.   REVIEW OF SYSTEMS:  Positive for lower abdominal cramping and dizziness  when standing.   PHYSICAL EXAMINATION:  GENERAL:  Well-nourished, well-developed female,  in moderate distress.  VITAL SIGNS:  She is afebrile, blood pressure 109/60, lying down, pulse  109; blood pressure 118/71, sitting, pulse 108; blood pressure 101/56,  standing, pulse 123.  LUNGS:  Clear to auscultation  bilaterally.  HEART: Regular rate and rhythm.  ABDOMEN: Soft and nontender.  Uterus was firm at the umbilicus on  abdominal palpation.   ADMITTING LABORATORY DATA:  Hemoglobin 4.5, hematocrit 12.8, white blood  cell count 10,000, and platelets 83,000.  Sodium 135, potassium 3.4, BUN  24, and creatinine 1.21.  Ultrasound was obtained and revealed large  hyperechoic area within the uterine cavity that was consistent with clot  and retained products of conception measuring approximately 10 x 11 cm  in diameter.   IMPRESSION:  Hematometra with retained products of conception, status  post repeat cesarean section, severe anemia.   PLAN:  To OR for evacuation of hematometra and retained products of  conception and placement of Bakri balloon tamponade catheter.   HOSPITAL COURSE:  The patient was taken to the operating room and  evacuation of a large amount of hematometra with small amount of  retained products of conception was performed by suction evacuation  approximately 3000 mL of clot, small tissue was obtained and the Bakri  balloon tamponade catheter was inserted at the conclusion of the  procedure.  The patient was kept for observation and initially did well  with the Bakri balloon tamponade.  At the end of the 24 hours of balloon  tamponade of the uterus, the patient had the onset of a large amount of  vaginal bleeding and uterus had enlarged significantly.  A decision was  made to proceed with supracervical hysterectomy for failure of suction  evacuation and balloon tamponade of large hematometra and retained  products of conception.  The patient was taken to the operating room and  supracervical hysterectomy was performed.  The procedure was complicated  by massive amount of blood loss, but the patient remained stable.  Postoperatively, the patient recovered slowly from severe anemia after  massive amount of transfusion of blood and coagulation products.  Critical care and  hematology consultations were obtained and the patient  continued to do well postoperatively.  On postop day #1, the patient had  hemoglobin of 10 and required no further transfusion of blood products.  DIC profile was within normal limits and continued to be within normal  limits postoperatively.  The patient showed remarkable recovery from the  massive blood loss and severe anemia and on postop day #4, had a  hemoglobin of 10.2, hematocrit of 29.2, white blood cell count of  10,800, and platelet count of 137,000.  Coags were within normal limits.  She was, therefore, discharged home on postop day #4 in good condition.   DISCHARGE LABORATORY DATA:  As stated, hemoglobin 10.2, hematocrit 29.2,  white blood cell count 10,800, and platelets 137,000.  TSH was within  normal limits of 2.826.  The coags were within normal limits with  prothrombin time of 13.3, PTT of 36, INR of 1.0, fibrinogen of 529,  sodium 137, potassium 3.6, glucose 99, BUN 7, creatinine 0.78, SGOT of  19, SGPT of 12.   DISCHARGED DISPOSITION:  Medications, Percocet and ibuprofen was  ascribed for pain.  The patient is to continue prenatal vitamins and  iron was also prescribed for restoration of her hemoglobin.  Written  instructions were given for discharge after hysterectomy.  The patient  is to call office for followup appointment in 2 weeks.      Charles A. Clearance Coots, M.D.  Electronically Signed     CAH/MEDQ  D:  09/22/2007  T:  09/22/2007  Job:  [id number]

## 2010-08-22 NOTE — Op Note (Signed)
NAME:  Nicole Lyons, Nicole Lyons             ACCOUNT NO.:  [id number]   MEDICAL RECORD NO.:  [id number]          PATIENT TYPE:  OBV   LOCATION:  9374                          [facility]ACILITY:  [facility]   PHYSICIAN:  Charles A. Clearance Coots, M.D.DATE OF BIRTH:  23-Apr-1965   DATE OF PROCEDURE:  09/17/2007  DATE OF DISCHARGE:                               OPERATIVE REPORT   PREOP DIAGNOSIS:  Hematometra and retained products of conception by  ultrasound status post cesarean section for placenta previa.   POSTOP DIAGNOSIS:  Hematometra and retained products of conception by  ultrasound status post cesarean section for placenta previa.   PROCEDURE:  Suction, evacuation, and insertion of Bakri balloon and  tamponade catheter.   SURGEON:  Charles A. Clearance Coots, MD   ANESTHESIA:  MAC with paracervical block.   ESTIMATED BLOOD LOSS:  Approximately 600 mL.   SPECIMEN:  Approximately 3,000 mL of clot and small amount of tissue.   COMPLICATIONS:  None.   Foley to gravity.   FINDINGS:  Large amount of clot and small amount of tissue.   OPERATION:  The patient was taken to the operating room and after  satisfactory IV sedation, sterile speculum   Dictation ended here at this point.      Charles A. Clearance Coots, M.D.  Electronically Signed     CAH/MEDQ  D:  09/17/2007  T:  09/18/2007  Job:  [id number]

## 2010-08-22 NOTE — Op Note (Signed)
NAME:  Nicole Lyons, Nicole Lyons             ACCOUNT NO.:  [id number]   MEDICAL RECORD NO.:  [id number]          PATIENT TYPE:  INP   LOCATION:  9110                          FACILITY:  [facility]   PHYSICIAN:  Roseanna Rainbow, M.D.DATE OF BIRTH:  1965/04/16   DATE OF PROCEDURE:  09/04/2007  DATE OF DISCHARGE:                               OPERATIVE REPORT   PREOPERATIVE DIAGNOSES:  1. Intrauterine pregnancy at term.  2. Complete posterior placenta previa.  3. History of previous cesarean deliveries.  4. Desires sterilization procedure.   POSTOPERATIVE DIAGNOSES:  1. Intrauterine pregnancy at term.  2. Complete posterior placenta previa.  3. History of previous cesarean deliveries.  4. Desires sterilization procedure.   PROCEDURES:  Repeat cesarean delivery and modified Pomeroy bilateral  tubal ligation.   SURGEON:  Roseanna Rainbow, MD   ANESTHESIA:  Spinal.   PATHOLOGY:  Placenta.   ESTIMATED BLOOD LOSS:  600 mL.   COMPLICATIONS:  None.   PROCEDURE IN DETAIL:  The patient was taken to the operating room with  an IV running.  Spinal anesthetic was administered.  She was then placed  in the dorsal supine position with a leftward tilt.  She was prepped and  draped in the usual sterile fashion.  After a time-out had been  completed, a Pfannenstiel skin incision was made with the scalpel  through the previous scar and carried to the underlying fascia.  The  fascia was nicked in the midline.  The fascial incision was then  extended bilaterally.  The superior aspect of the fascial incision was  tented up and the underlying rectus muscles dissected off.  The inferior  aspect of the fascial incision was manipulated in a similar fashion.  The rectus muscles were separated in the midline.  The parietal  peritoneum was tented up and entered sharply.  This incision was then  extended superiorly and inferiorly with good visualization of the  bladder.  The bladder blade was then placed.   There was a plexus of  veins overlying the bladder dome.  The vesicouterine peritoneum was  tented up and entered sharply.  This incision was then extended  bilaterally and the bladder flap created sharply.  The bladder blade was  replaced.  The lower uterine segment was incised in a transverse fashion  with a scalpel.  This incision was then extended bluntly.  The infant's  head was then delivered with vacuum assistance.  The infant's head was  delivered with the second attempt.  The oropharynx was suctioned with  bulb suction.  The cord was clamped and cut.  The infant was handed off  to the awaiting neonatologist.  The placenta was then removed.  The  intrauterine cavity was evacuated of any remaining amniotic fluid clots  and debris with a moist laparotomy sponge.  The uterine incision was  then reapproximated in a running interlocking fashion using 0 Monocryl.  A second imbricating layer of the same suture was placed.  Venous  bleeding points on the dome of the bladder and in the bladder flap area  were controlled with figure-of-eight sutures of 2-0 Vicryl  suture.  The  left fallopian tube was then grasped with Babcock clamp and followed out  to the fimbriated end.  The mid isthmic portion of the tube was then  regrasped.  A 1-2 cm segment of tube was doubly ligated with 0 plain and  excised.  Adequate hemostasis was noted.  The right fallopian tube was  then manipulated in a similar fashion.  The paracolic gutters were then  irrigated.  The parietal peritoneum was then reapproximated in a running  fashion using 2-0 Vicryl.  The fascia was closed in a running fashion  using running suture of 0 PDS.  The skin was closed in a subcuticular  fashion.  At the close of the procedure, the instrument and pack counts  were said to be correct x2.  The patient was taken to the PACU awake and  in stable condition.      Roseanna Rainbow, M.D.  Electronically Signed     LAJ/MEDQ  D:   09/04/2007  T:  09/04/2007  Job:  [id number]

## 2010-08-22 NOTE — Op Note (Signed)
Nicole Lyons, Nicole Lyons             ACCOUNT NO.:  [id number]   MEDICAL RECORD NO.:  [id number]          PATIENT TYPE:  INP   LOCATION:  9374                          [facility]   PHYSICIAN:  Roseanna Rainbow, M.D.DATE OF BIRTH:  1965/04/10   DATE OF PROCEDURE:  09/18/2007  DATE OF DISCHARGE:                               OPERATIVE REPORT   PREOPERATIVE DIAGNOSIS:  Postpartum hemorrhage, disseminated  intravascular coagulation.   POSTOPERATIVE DIAGNOSIS:  Postpartum hemorrhage, disseminated  intravascular coagulation.   PROCEDURE:  Supracervical hysterectomy, abdominal.   SURGEONS:  Roseanna Rainbow, MD  Bing Neighbors. Clearance Coots, MD   ANESTHESIA:  General endotracheal.   ESTIMATED BLOOD LOSS:  3 liters.   IV FLUID:  As per Anesthesiology.   URINE OUTPUT:  As per Anesthesiology.   COMPLICATIONS:  Lacerated uterine vessel with excessive bleeding.   OPERATIVE FINDINGS:  At the time of laparotomy, the uterine fundus was  distended by copious blood clots.  The uterus was approximately 18  weeks' size in aggregate.   PROCEDURE IN DETAIL:  The patient was taken to the operating room with 2  IVs in place.  In the operating room, a central line was placed as well  as an arterial line, after general anesthesia was induced.  The Bakri  balloon was deflated and removed.  The patient was placed in the dorsal  supine position and prepped and draped in the usual sterile fashion.  After a time-out had been completed, the previous Pfannenstiel skin  incision was incised with a scalpel.  This was carried down to the  underlying fascia.  The fascia was nicked in the midline.  This incision  was then extended bilaterally.  The superior aspect of the fascial  incision was tented up and the underlying rectus muscles dissected off.  The inferior aspect of the fascial incision was manipulated in a similar  fashion.  The rectus muscles were separated in the midline.  The  parietal  peritoneum was tented up and entered.  This incision was then  extended superiorly and inferiorly with good visualization of the  bladder.  The rectus muscle bodies medially were then transected using  the Bovie.  The uterus was then exteriorized.  The bowel was packed away  with moist laparotomy sponges.  A Deaver was used for retraction and a  bladder blade.  The long Kelly clamps were placed on the cornu  bilaterally.  The round ligaments were suture ligated and divided using  the Bovie.  The broad ligament was incised to the vesicouterine  peritoneum in the midline bilaterally.  The bladder was then dissected  off the lower uterine segment and cervix using sharp dissection.  The  utero-ovarian ligaments and fallopian tubes were then serially clamped  with parametrial clamps, and divided.  The pedicles were secured with  both free ligatures and suture ligatures of 0 Vicryl.  The uterine  vessels were then clamped bilaterally at the level of the previous C-  section scar on the uterus.  During this process, the patient's right  uterine vessel had a laceration lateral to the ureter and  this was  clamped with tonsils.  The releasing bites were then taken in the  cardinal ligaments with straight parametrial clamps.  These pedicles  were secured with suture ligatures of 0 Vicryl.  The uterine fundus was  then amputated above the level of the cervix.  The O'Connor-O'Sullivan  retractor was then placed into the incision.  The bowel was repacked.  The right uterine vessel was inspected.  The right ureter was traced  into the tunnel leading to the bladder.  The laceration in the uterine  vessel was then secured with vascular clamps as well as suture  ligatures.  Adequate hemostasis was noted.  Indigo carmine was then  given intravenously.  There is no spillage of the indigo carmine noted.  The pelvis was then copiously irrigated.  All the vascular pedicles were  felt to be hemostatic.  All the  instruments were then removed from the  abdomen as well as the packing.  The parietal peritoneum was then re-  approximated in a running fashion using 2-0 Vicryl.  The fascia was  closed in a running fashion of using a #1 PDS.  The subcutaneous layer  was then inspected for bleeding points.  Individual bleeding points were  then cauterized with the Bovie.  The skin was closed with staples.  At  the closure of the procedure, the instrument and pack counts were said  to be correct x2.  The patient was taken to the PACU.      Roseanna Rainbow, M.D.  Electronically Signed     LAJ/MEDQ  D:  09/19/2007  T:  09/19/2007  Job:  [id number]

## 2010-08-25 NOTE — Discharge Summary (Signed)
NAME:  Nicole Lyons, Nicole Lyons                       ACCOUNT NO.:  [id number]   MEDICAL RECORD NO.:  [id number]                   PATIENT TYPE:  INP   LOCATION:  9121                                 [facility]   PHYSICIAN:  Janine Limbo, M.D.            DATE OF BIRTH:  November 19, 1965   DATE OF ADMISSION:  10/05/2003  DATE OF DISCHARGE:                                 DISCHARGE SUMMARY   ADMISSION DIAGNOSES:  1. Intrauterine pregnancy at 32 and five-sevenths weeks.  2. Previous cesarean section, desires repeat.  3. History of intrauterine fetal demise at term.   DISCHARGE DIAGNOSES:  1. Intrauterine pregnancy at 75 and five-sevenths weeks.  2. Previous cesarean section, desires repeat.  3. History of intrauterine fetal demise at term.  4. Postpartum hemorrhage without hemodynamic instability.  5. Postpartum anemia.   HOSPITAL PROCEDURES:  1. Spinal anesthesia.  2. Repeat low transverse cesarean section.   HOSPITAL COURSE:  The patient was admitted for an elective repeat cesarean  section secondary to history of IUFD at term.  Surgery was remarkable for  postpartum hemorrhage and an estimated blood loss of 1500 mL.  She was  delivered of a female infant in the vertex presentation with Apgars 5, 8,  and 9; cord pH of 7.16; and no other complications.  She was taken to the  mother-baby unit after recovery and did well there.  Vital signs remained  stable.  She had no dizziness or shortness of breath when out of bed.  Hemoglobin was 6.8 on postoperative day #1.  Incision was clean, dry, and  intact.  JP was draining only 30 mL of serosanguineous fluid and routine  postoperative care was given.  Foley was removed on postoperative day #1.  Her postoperative day #2 she was doing well, pain was well controlled.  Abdomen was soft and appropriately tender.  Incision was clean, dry, and  intact.  JP was draining a small amount of fluid.  Lochia was small.  Extremities were within normal  limits and the patient was requesting early  discharge and was discharged home.   DISCHARGE MEDICATIONS:  1. Motrin 600 mg p.o. q.6h. p.r.n.  2. Tylox one to two p.o. q.4h. p.r.n.  3. Ortho Tri-Cyclen Lo one p.o. daily.  4. Prenatal vitamin one p.o. daily.  5. Iron sulfate twice per day.   DISCHARGE LABORATORY DATA:  White blood cell count 10.8, hemoglobin 6.8,  hematocrit 19.9, platelet count 233.  DIC profile was within normal limits  except for a D-dimer of 11.3.   DISCHARGE INSTRUCTIONS:  Per [facility] handout.   DISCHARGE FOLLOW-UP:  In 6 weeks or p.r.n.     Marie L. Williams, C.N.M.                 Janine Limbo, M.D.    MLW/MEDQ  D:  10/07/2003  T:  10/07/2003  Job:  [id number]

## 2010-08-25 NOTE — H&P (Signed)
NAME:  Nicole Lyons, Nicole Lyons                       ACCOUNT NO.:  [id number]   MEDICAL RECORD NO.:  [id number]                   PATIENT TYPE:  INP   LOCATION:  NA                                   [facility]   PHYSICIAN:  Nicole A. Dillard, M.D.              DATE OF BIRTH:  1965-11-24   DATE OF ADMISSION:  DATE OF DISCHARGE:                                HISTORY & PHYSICAL   CHIEF COMPLAINT:  Term pregnancy, desires repeat cesarean section.   HISTORY AND PHYSICAL:  Patient is a 45 year old African-American female  gravida 5, para 2-0-2-1, who was at 39-5/7 weeks, for repeat Cesarean  section.  Estimated date of confinement was confirmed by eight week  ultrasound.  Her prenatal care has been with [facility] since 14 weeks.  Pregnancy  is remarkable by:  1)  Previous C-section and desires repeat C-section.  Patient was given the option of VBAC and told risks and benefits of each and  desires to go with c-section.  2)  She has a history of a previous  intrauterine fetal demise __________.  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  Patient had a VBAC delivery with a fetal demise.  She  has been followed by ultrasounds and biophysicals which have been normal,  and also __________.  Patient is advanced maternal age.  She had an  amniocentesis at 18 weeks which was found to be normal female.  4)  Patient  has ptyalism.  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  6)  She  had labile blood pressures.  Her pH workup had been normal.  7)  She is GBS  positive.  8)  She had BV in her first trimester which was treated.  9)  She  is anemic and on iron.   PRENATAL LABORATORIES:  B positive, antibody negative.  Last hemoglobin was  9.1.  Sickle cell prep is negative.  RPR is nonreactive, Rubella immune.  Pap smear in January, 2005, was normal.  GC Chlamydia both normal.  One hour  Glucola was 117.  The patient is GBS positive.   PAST OBSTETRICS HISTORY:  In April of 2000, she  had a C-section secondary to  failure to progress, weighing 8 pounds and 5 ounces, a little girl.  In  2003, she VBAC'd a stillborn at 40 weeks.  In 2002, she had two spontaneous  miscarriages in the first trimester without any problems.   PAST GYNECOLOGIC HISTORY:  Significant for menarche at age 89 occurring  every 28 days, lasting for five days.  She does have a history of herpes  which she is on suppression for.  She has a history of abnormal Pap smear  years ago, her last Pap was normal.   MEDICATIONS INCLUDE:  Valtrex suppression and prenatal vitamins.   PAST MEDICAL HISTORY:  Significant for, as above, an irritable bowel  syndrome.   FAMILY HISTORY:  Significant for a mother with hypertension.   SOCIAL HISTORY:  Negative for alcohol, tobacco and illicit drug use.   GENETIC HISTORY:  Advanced maternal age with normal amnio, otherwise  unremarkable.   REVIEW OF SYSTEMS:  GENITOURINARY:  Patient is pregnant.  CARDIOVASCULAR:  Unremarkable.  GASTROINTESTINAL:  Has a history of irritable bowel syndrome.  MUSCULOSKELETAL:  Unremarkable.  PSYCHIATRIC:  Unremarkable.   PHYSICAL EXAMINATION:  The patient weighs 199 pounds.  Blood pressure is  120/80, fundal height is 39 cm.  She is in no apparent distress.  HEART:  Regular rate and rhythm.  LUNGS:  Clear to auscultation bilaterally.  SKIN:  Moist mucous membranes.  THYROID:  No masses.  ABDOMEN:  Soft, gravid and nontender.  VULVOVAGINAL EXAM:  Within normal limits.  Last cervical exam was long and  closed.  EXTREMITIES:  Have trace edema.  No cyanosis or clubbing bilaterally.   ASSESSMENT:  Pregnancy at term, desires repeat Cesarean section.  The  patient understands the risks are, but not limited to, bleeding infection,  damage to internal organs such as bowel, bladder and major blood vessels.  Also told that had the option to have vaginal birth after a cesarean section  and told the risks and benefits of that.  Patient  desires to proceed with  repeat C-section.                                               Nicole Lyons, M.D.    NAD/MEDQ  D:  10/05/2003  T:  10/05/2003  Job:  [id number]

## 2010-08-25 NOTE — Op Note (Signed)
NAME:  Nicole Lyons, Nicole Lyons             ACCOUNT NO.:  [id number]   MEDICAL RECORD NO.:  [id number]          PATIENT TYPE:  OIB   LOCATION:  2550                         FACILITY:  [facility]   PHYSICIAN:  Leonie Man, M.D.   DATE OF BIRTH:  07-29-1965   DATE OF PROCEDURE:  05/15/2004  DATE OF DISCHARGE:                                 OPERATIVE REPORT   PREOPERATIVE DIAGNOSIS:  Follicular lesion over the right thyroid.   POSTOPERATIVE DIAGNOSIS:  Follicular lesion over the right thyroid.   PATHOLOGY:  Pending.   PROCEDURE:  Right thyroid lobectomy and isthmusectomy.   SURGEON:  Leonie Man, M.D.   ASSISTANT:  Gita Kudo, M.D.   ANESTHESIA:  General.   The patient is a 45 year old woman with an enlarging right sided thyroid  mass which is cold on scanning and biopsy shows a follicular lesion, cannot  rule out carcinoma. She comes to the operating room now for a right-sided  thyroid lobectomy but the understanding that if indeed there is a cancer  here, she may also have to undergo total thyroidectomy. The risks and  potential benefits of surgery been fully discussed including the risks of  recurrent laryngeal nerve injury, hypoparathyroidism, bleeding and  infection. The patient understands these risks and gives consent to surgery.   PROCEDURE:  Following induction of satisfactory general anesthesia the  patient positioned supinely, head and neck hyperextended,  the neck was  prepped and draped to be included in a sterile operative field. A collar  incision made approximately two fingerbreadths above the sternal notch and  deepened through skin and subcutaneous tissue down across the platysma. The  superior flap raised up to the thyroid cartilage inferior flap down to the  sternal notch. The strap muscles are opened through the midline up to the  tracheal cartilage and down to the sternal notch. The strap muscles are  retracted laterally on the right side to reveal the  right thyroid.  The  thyroid is elevated. The middle thyroid vein is doubly clipped and then  transected. Dissection carried down to the lower pole where the lower pole  parathyroid gland is identified and this region dissected free from the low  from the lower pole thyroid.  The upper pole vessels are then mobilized and  doubly secured with 2-0 silk ties. The thyroid is then dissected free from  off of the surrounding tissues maintaining hemostasis throughout the course  of the dissection. The thyroid lobe was then carried off over the trachea.  The recurrent laryngeal nerve is identified and protected throughout the  course of the dissection. The isthmus is transected between clamps and  secured with suture ligatures of 3-0 Vicryl sutures. Frozen section  diagnosis returned follicular lesion. Final pathology is pending. Wound is  then thoroughly irrigated with normal saline. Sponge, instrument and sharp  counts verified. All areas of dissection checked for hemostasis and noted to  be dry.  I did put a small patch of Surgicel in the wound for additional  hemostasis. The midline strap muscles were then closed with running 3-0  Vicryl sutures. Platysma muscle closed  with interrupted 3-0 Vicryl  sutures, skin with running 5-0 Monocryl suture and then reinforced with  Steri-Strips. Sterile dressings applied. Anesthetic reversed. The patient  removed from the operating room to the recovery room in stable condition.  She tolerated the procedure well.      PB/MEDQ  D:  05/15/2004  T:  05/15/2004  Job:  [id number]   cc:   Ricki Rodriguez, M.D.  [address]  [place] [address]  Fax: [phone]

## 2010-08-25 NOTE — Op Note (Signed)
NAME:  Nicole Lyons, Nicole Lyons                       ACCOUNT NO.:  [id number]   MEDICAL RECORD NO.:  [id number]                   PATIENT TYPE:  INP   LOCATION:  9121                                 [facility]   PHYSICIAN:  Naima A. Dillard, M.D.              DATE OF BIRTH:  16-Jul-1965   DATE OF PROCEDURE:  DATE OF DISCHARGE:                                 OPERATIVE REPORT   PREOPERATIVE DIAGNOSES:  History of previous cesarean section, desires  repeat cesarean section and history of intrauterine fetal demise at term.   POSTOPERATIVE DIAGNOSES:  History of previous cesarean section, desires  repeat cesarean section and history of intrauterine fetal demise at term.  Postpartum hemorrhage.   PROCEDURE:  Repeat cesarean section.   ANESTHESIA:  Spinal with local anesthesia.   SURGEON:  Naima A. Normand Sloop, M.D.   ASSISTANT:  Osborn Coho, M.D. and Renaldo Reel. Latham, C.N.M.   ESTIMATED BLOOD LOSS:  1500 mL.   URINE OUTPUT:  300 mL clear urine.   IV FLUIDS:  3500 mL crystalloid.   FINDINGS:  Female infant in vertex presentation with Apgar of 5, 8 and 9.  Cord pH of 7.16, clear fluid with no meconium.   COMPLICATIONS:  Hemorrhage.  The patient went to the recovery room in stable  condition.   DESCRIPTION OF PROCEDURE:  The patient was taken to the operating room where  she was given spinal anesthesia.  Her spinal anesthesia was slow to work but  it was found to be adequate. Because of this, we did decide to give her 20  mL of 1% lidocaine along the incision line. An incision was then made with a  scalpel and carried down to the fascia using Bovie cautery. The fascia was  dissected in the midline and extended bilaterally using Bovie cautery.  Kocher clamps were placed on the superior aspect of the fascia which was  dissected off the rectus muscle both bluntly and sharply. The inferior  aspect of the fascia was dissected off the rectus muscle in a similar  fashion.  The rectus  muscles were separated in the midline, peritoneum  identified, tinted up and entered sharply with Metzenbaum scissors. The  incision was extended bilaterally bluntly.  The vesicouterine peritoneum was  identified and entered sharply with Metzenbaum scissors, extended  bilaterally. A bladder flap was created digitally. A bladder blade was  inserted. A lower transverse uterine incision was then made with the  scalpel. The lower uterine segment was not well developed, it was very  thick. It was extended using bandage scissors. The bag of fluid was ruptured  with clear fluid.  It was very difficult to deliver the infant through the  incision vacuum. The head was delivered at first. There was three pulls with  the vacuum with two pop-offs all in the green zone, 100 mmHg without  success. The muscle was then cut on both sides with bandage scissors.  The  skin incision was also elongated and baby's head finally delivered  atraumatically. There was a nuchal cord x1 which was reduced. The infant was  delivered, mouth and nares were cleared of all clot and debris. The cord was  clamped and cut. Cord gas was obtained and noted to be 7.16, Apgar were 5, 8  and 9. The infant was handed off to the waiting pediatricians. The placenta  was manually delivered, the uterus was cleared of all clot and debris. The  uterine incision was repaired with #0 Vicryl in a running locked fashion. A  second layer of imbrication was used, still was bleeding and a third layer  of imbrication was used still with some bleeding. There were figure-of-eight  stitches placed. The patient then began to have oozing even when we placed  stitches. We did obtain coagulation studies which were found to be within  normal limits. She was still anemic at 8.4 starting at 9.1.  There was also  some oozing along the bladder flap. This was made hemostatic with pressure  and Surgicel. The patient continued to have oozing, we applied pressure  and  used Bovie cautery.  The oozing finally started to dissipate. The patient  then began to have pain and we poured Nesacaine into the abdomen. The  patient became hemostatic and her muscles were reapproximated. There was  some bleeding along the muscles which were made hemostatic with 3-0 Vicryl.  After obtaining hemostasis along the uterus and bladder flap, again  irrigation was done.  Hemostasis was assured.  Her peritoneum was  reapproximated with #0 Chromic in a running locked fashion. Any bleeding  areas were made hemostatic with Bovie cautery or 3-0 Vicryl with a stitch.  Hemostasis was assured. Again irrigation was done in the fascia. The patient  then began to have pain again and again 10 mL of 0.25% Marcaine was injected  into the fascia.  The fascia was closed with #0 Vicryl in a running fashion.  The subcutaneous area was made hemostatic using Bovie cautery after  irrigation.  The skin was closed with 3-0 Monocryl. Sponge, lap and needle  counts were correct x2.  A Jackson-Pratt drain was placed in the  subcutaneous space.  Sponge, lap and needle counts were correct x2.  The  patient went to the recovery room in stable condition.                                               Naima A. Normand Sloop, M.D.    NAD/MEDQ  D:  10/05/2003  T:  10/05/2003  Job:  [id number]

## 2010-09-14 ENCOUNTER — Ambulatory Visit: Payer: Self-pay | Admitting: Vascular Surgery

## 2011-01-03 LAB — TYPE AND SCREEN

## 2011-01-03 LAB — CBC
HCT: 21.5 — ABNORMAL LOW
HCT: 27.7 — ABNORMAL LOW
Hemoglobin: 7.4 — CL
MCHC: 34
MCHC: 34.5
MCV: 77.3 — ABNORMAL LOW
Platelets: 222
RBC: 2.8 — ABNORMAL LOW
RDW: 16.5 — ABNORMAL HIGH

## 2011-01-03 LAB — RPR: RPR Ser Ql: NONREACTIVE

## 2011-01-04 LAB — CBC
HCT: 14.8 — ABNORMAL LOW
HCT: 16.5 — ABNORMAL LOW
HCT: 29.2 — ABNORMAL LOW
HCT: 30.1 — ABNORMAL LOW
HCT: 34.5 — ABNORMAL LOW
Hemoglobin: 10.2 — ABNORMAL LOW
Hemoglobin: 12.2
Hemoglobin: 4.5 — CL
Hemoglobin: 5.4 — CL
Hemoglobin: 5.9 — CL
MCHC: 34.9
MCHC: 35.2
MCHC: 35.2
MCHC: 35.3
MCHC: 35.4
MCHC: 36
MCHC: 36.2 — ABNORMAL HIGH
MCHC: 36.5 — ABNORMAL HIGH
MCV: 77 — ABNORMAL LOW
MCV: 86.5
MCV: 86.9
MCV: 91.2
MCV: 92.1
MCV: 92.2
Platelets: 22 — CL
Platelets: 42 — CL
Platelets: 43 — CL
Platelets: 44 — CL
Platelets: 50 — ABNORMAL LOW
Platelets: 51 — ABNORMAL LOW
Platelets: 97 — ABNORMAL LOW
RBC: 1.66 — ABNORMAL LOW
RBC: 3.16 — ABNORMAL LOW
RBC: 3.78 — ABNORMAL LOW
RDW: 14.3
RDW: 14.7
RDW: 14.7
RDW: 15.2
RDW: 15.4
RDW: 15.5
RDW: 17.5 — ABNORMAL HIGH
WBC: 10.8 — ABNORMAL HIGH
WBC: 7
WBC: 7.7
WBC: 8.1

## 2011-01-04 LAB — COMPREHENSIVE METABOLIC PANEL
AST: 19
Albumin: 1.8 — ABNORMAL LOW
Albumin: 2.3 — ABNORMAL LOW
Alkaline Phosphatase: 60
BUN: 10
BUN: 22
BUN: 7
CO2: 25
CO2: 29
Calcium: 6.2 — CL
Calcium: 6.3 — CL
Calcium: 7 — ABNORMAL LOW
Calcium: 8 — ABNORMAL LOW
Chloride: 103
Creatinine, Ser: 0.78
Creatinine, Ser: 1.21 — ABNORMAL HIGH
GFR calc Af Amer: 59 — ABNORMAL LOW
GFR calc Af Amer: >60
GFR calc non Af Amer: 49 — ABNORMAL LOW
GFR calc non Af Amer: >60
Glucose, Bld: 102 — ABNORMAL HIGH
Glucose, Bld: 110 — ABNORMAL HIGH
Glucose, Bld: 132 — ABNORMAL HIGH
Glucose, Bld: 99
Potassium: 3.3 — ABNORMAL LOW
Potassium: 3.5
Sodium: 135
Sodium: 136
Total Bilirubin: 0.8
Total Protein: 4.1 — ABNORMAL LOW
Total Protein: 4.3 — ABNORMAL LOW
Total Protein: 6.1

## 2011-01-04 LAB — TYPE AND SCREEN
ABO/RH(D): B POS
Antibody Screen: NEGATIVE

## 2011-01-04 LAB — PREPARE CRYOPRECIPITATE

## 2011-01-04 LAB — FIBRINOGEN
Fibrinogen: 159 — ABNORMAL LOW
Fibrinogen: 94 — CL

## 2011-01-04 LAB — BASIC METABOLIC PANEL
BUN: 16
CO2: 23
CO2: 26
Calcium: <4 — CL
Chloride: 111
Creatinine, Ser: 1.3 — ABNORMAL HIGH
GFR calc Af Amer: >60
Glucose, Bld: 111 — ABNORMAL HIGH
Glucose, Bld: 123 — ABNORMAL HIGH
Potassium: 3.3 — ABNORMAL LOW
Potassium: 3.5
Sodium: 138
Sodium: 140

## 2011-01-04 LAB — PREPARE PLATELET PHERESIS

## 2011-01-04 LAB — PREPARE FRESH FROZEN PLASMA

## 2011-01-04 LAB — URINE MICROSCOPIC-ADD ON

## 2011-01-04 LAB — DIC (DISSEMINATED INTRAVASCULAR COAGULATION)PANEL
D-Dimer, Quant: 2.59 — ABNORMAL HIGH
D-Dimer, Quant: 3.03 — ABNORMAL HIGH
D-Dimer, Quant: 4 — ABNORMAL HIGH
D-Dimer, Quant: 6.95 — ABNORMAL HIGH
Fibrinogen: 135 — ABNORMAL LOW
Fibrinogen: 145 — ABNORMAL LOW
Fibrinogen: 157 — ABNORMAL LOW
INR: 1
INR: 1.1
INR: 1.3
INR: 1.4
Platelets: 136 — ABNORMAL LOW
Platelets: 44 — CL
Platelets: 51 — ABNORMAL LOW
Platelets: 95 — ABNORMAL LOW
Prothrombin Time: 13.3
Prothrombin Time: 14
Prothrombin Time: 14.5
Prothrombin Time: 16.3 — ABNORMAL HIGH
Prothrombin Time: 17.2 — ABNORMAL HIGH
Smear Review: NONE SEEN
Smear Review: NONE SEEN
aPTT: 30
aPTT: 33
aPTT: 35
aPTT: 35

## 2011-01-04 LAB — LACTIC ACID, PLASMA: Lactic Acid, Venous: 3.5 — ABNORMAL HIGH

## 2011-01-04 LAB — URINALYSIS, ROUTINE W REFLEX MICROSCOPIC
Glucose, UA: NEGATIVE
Glucose, UA: NEGATIVE
Ketones, ur: NEGATIVE
Leukocytes, UA: NEGATIVE
Nitrite: NEGATIVE
Protein, ur: 100 — AB
Specific Gravity, Urine: >1.03 — ABNORMAL HIGH
pH: 6

## 2011-01-04 LAB — ELECTROLYTE PANEL: Chloride: 113 — ABNORMAL HIGH

## 2011-01-04 LAB — TSH: TSH: 2.826

## 2011-01-04 LAB — PREPARE RBC (CROSSMATCH)

## 2011-01-04 LAB — APTT: aPTT: 39 — ABNORMAL HIGH

## 2011-01-23 ENCOUNTER — Encounter: Payer: Self-pay | Admitting: Pharmacist

## 2011-01-23 NOTE — Telephone Encounter (Signed)
This encounter was created in error - please disregard.

## 2011-08-18 IMAGING — US US TRANSVAGINAL NON-OB
1 series · 13 of 24 positions shown · non-contrast
Comparison: 08/24/2008

CLINICAL DATA: Follow-up right adnexal complex mass.  Status post
hysterectomy.

TRANSVAGINAL ULTRASOUND OF PELVIS
TECHNIQUE: Transvaginal ultrasound examination of the pelvis was
performed including evaluation of the ovaries, adnexal regions, and
pelvic cul-de-sac.

[Series 1: us transvaginal non-ob · 0.19mm/px · 24 acquisitions, 13 frames shown]
[im 1/24]
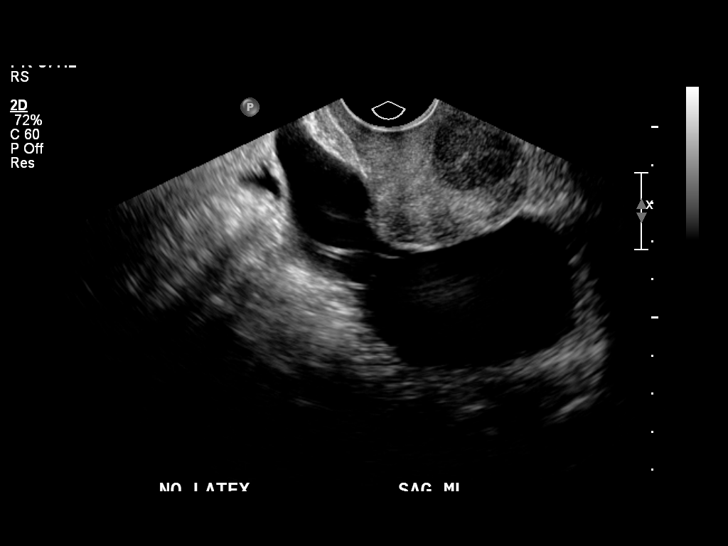
[im 3/24]
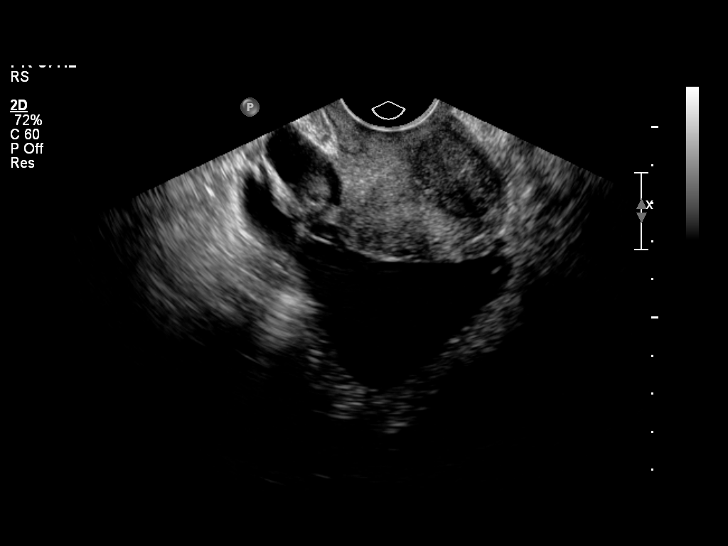
[im 5/24]
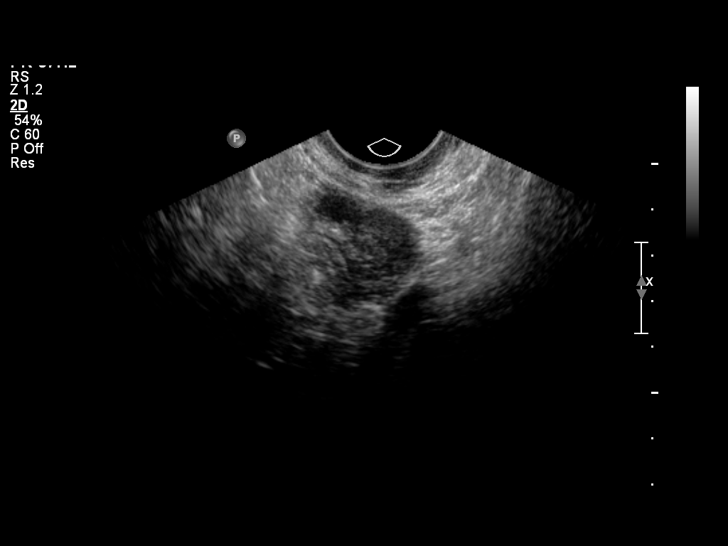
[im 7/24]
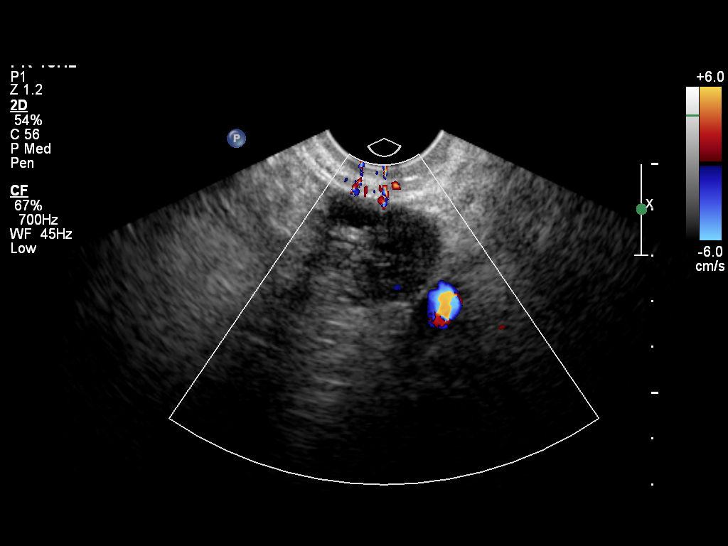
[im 9/24]
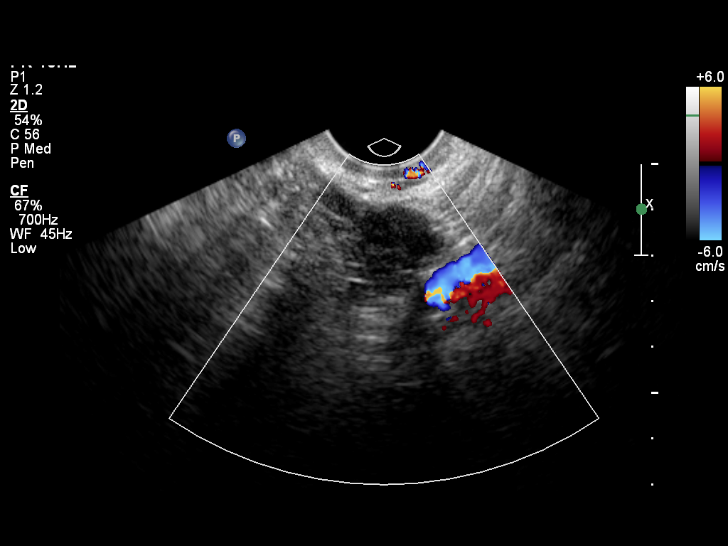
[im 11/24]
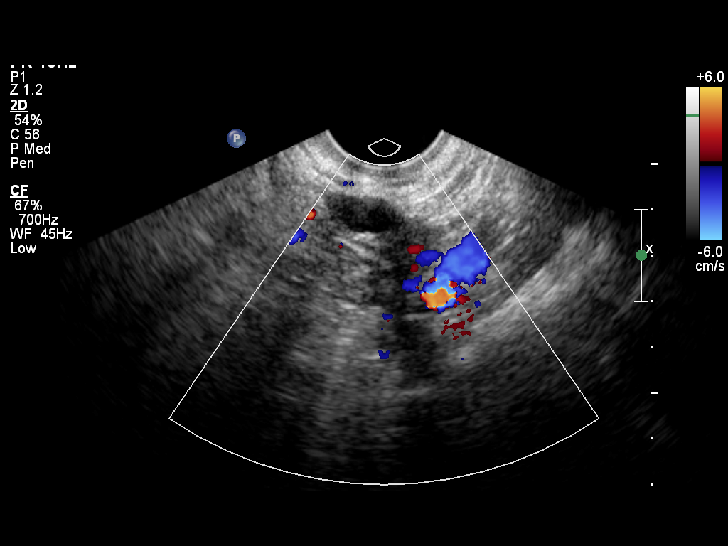
[im 13/24]
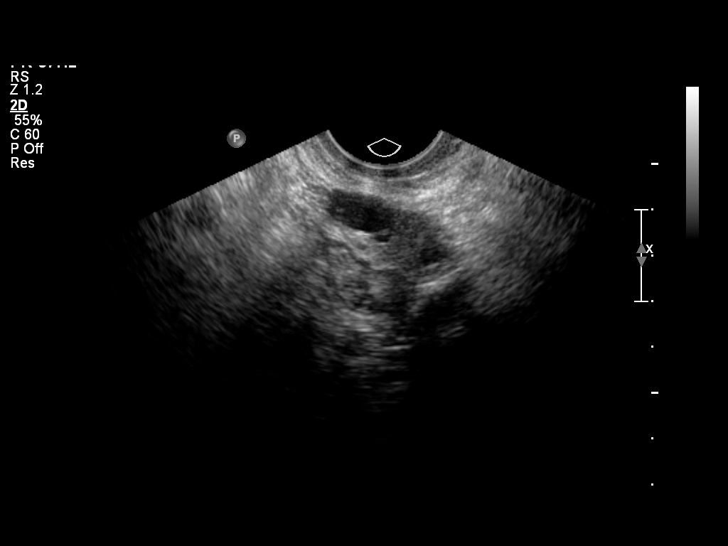
[im 14/24]
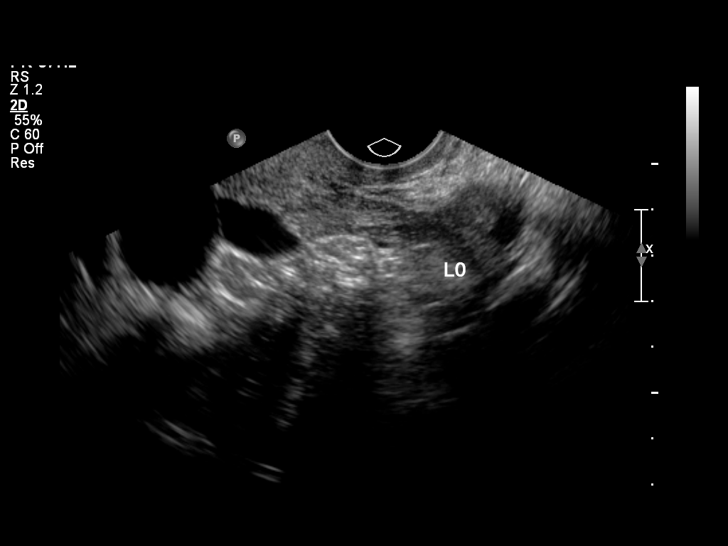
[im 16/24]
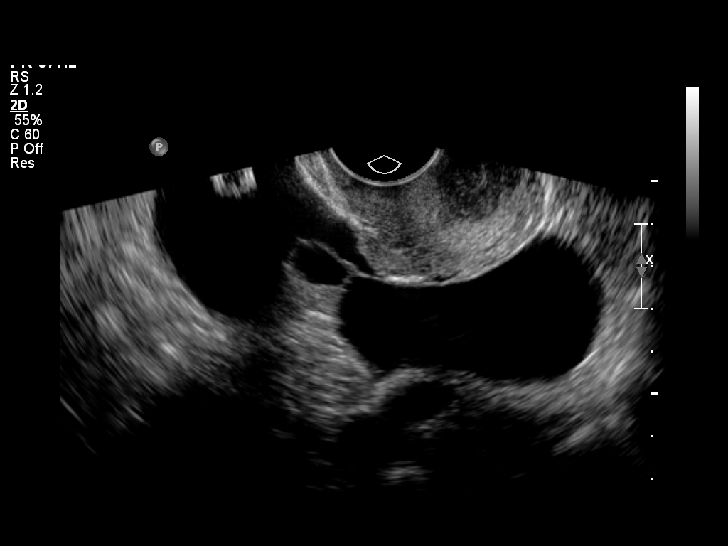
[im 18/24]
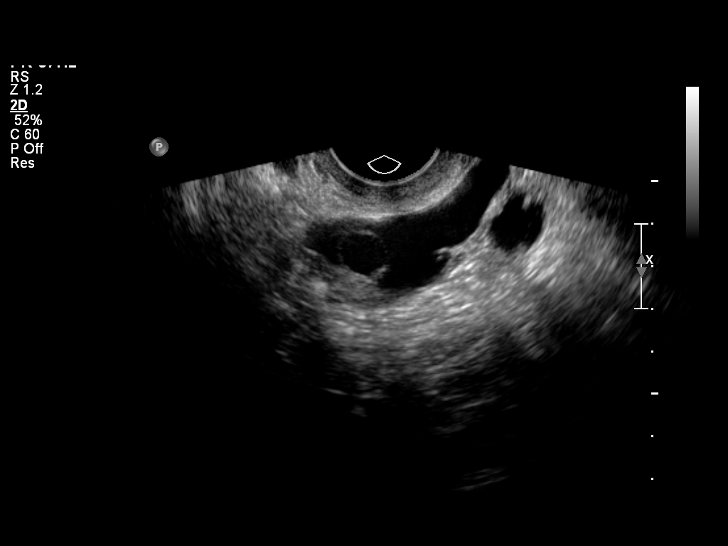
[im 20/24]
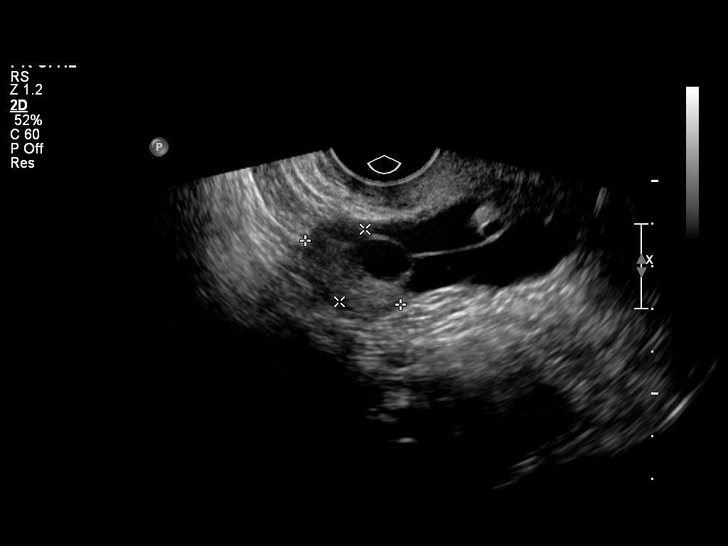
[im 22/24]
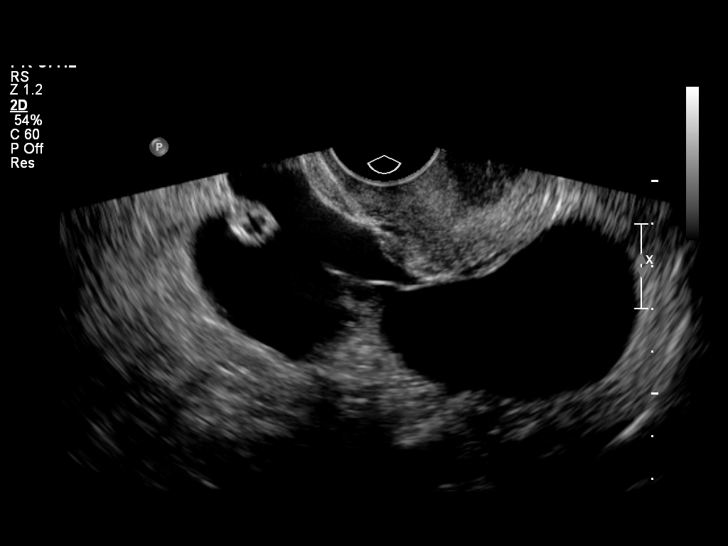
[im 24/24]
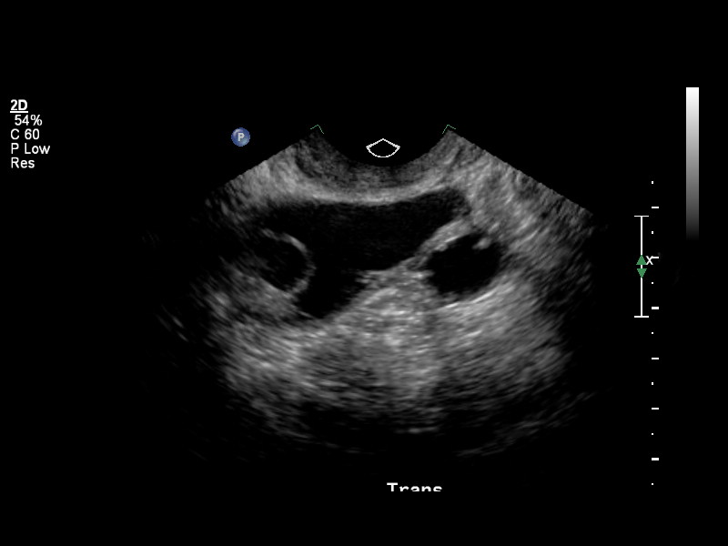

[13 of 24 positions shown; findings below may reference images not displayed]

FINDINGS: Uterus has been surgically removed.  The appearance of the residual
tissue suggests a supracervical hysterectomy.

Endometrium not applicable

Right Ovary in the right adnexa again noted is a thin walled
serpiginous tubular fluid filled structure.  This demonstrates
little interval change in comparison with the prior exam with the
complex measuring approximately 10.5 x 4.4 by 5.4 cm.  On the
periphery of this thin walled tubular structure is what appears to
represent more normal ovarian tissue measuring 2.7 x
centimeters.On the prior exam, this portion had a complex cystic
structure within it which is no longer seen and was likely a small
hemorrhagic ovarian cyst.

Left Ovary appears normal measuring 2.5 x 1.3 x 1.7 cm

Other Findings:  No free pelvic fluid is seen.
IMPRESSION: Little interval change in the appearance of the complex right
adnexal mass.  The majority of this mass appears to represent a
thin walled tubular cystic structure felt likely to represent a
hydrosalpinx.  The solid portion of this has an appearance
suggesting  normal ovarian tissue and I suspect that the right
ovary may be an adhesed to the aforementioned hydrosalpinx.  If
surgical evaluation is not being contemplated, continued close
follow-up would be recommended with rescanning in 6 months to
assess for stability of this suspected benign process.

## 2013-04-28 ENCOUNTER — Other Ambulatory Visit: Payer: Self-pay | Admitting: Cardiovascular Disease

## 2013-04-28 ENCOUNTER — Ambulatory Visit
Admission: RE | Admit: 2013-04-28 | Discharge: 2013-04-28 | Disposition: A | Discharge status: Home / Self Care | Payer: BC Managed Care – PPO | Source: Ambulatory Visit | Attending: Cardiovascular Disease | Admitting: Cardiovascular Disease

## 2013-04-28 DIAGNOSIS — R059 Cough, unspecified: Secondary | ICD-10-CM

## 2013-04-28 DIAGNOSIS — R062 Wheezing: Secondary | ICD-10-CM

## 2013-04-28 DIAGNOSIS — R05 Cough: Secondary | ICD-10-CM

## 2013-06-15 ENCOUNTER — Other Ambulatory Visit: Payer: Self-pay

## 2013-06-15 DIAGNOSIS — Z1231 Encounter for screening mammogram for malignant neoplasm of breast: Secondary | ICD-10-CM

## 2013-07-07 ENCOUNTER — Ambulatory Visit
Admission: RE | Admit: 2013-07-07 | Discharge: 2013-07-07 | Disposition: A | Discharge status: Home / Self Care | Payer: BC Managed Care – PPO | Source: Ambulatory Visit

## 2013-07-07 DIAGNOSIS — Z1231 Encounter for screening mammogram for malignant neoplasm of breast: Secondary | ICD-10-CM

## 2019-08-03 ENCOUNTER — Other Ambulatory Visit: Payer: Self-pay | Admitting: Cardiovascular Disease

## 2019-08-03 DIAGNOSIS — Z1231 Encounter for screening mammogram for malignant neoplasm of breast: Secondary | ICD-10-CM

## 2021-06-14 ENCOUNTER — Encounter (HOSPITAL_COMMUNITY): Payer: Self-pay | Admitting: Emergency Medicine

## 2021-06-14 ENCOUNTER — Emergency Department (HOSPITAL_COMMUNITY): Payer: BLUE CROSS/BLUE SHIELD

## 2021-06-14 ENCOUNTER — Inpatient Hospital Stay (HOSPITAL_COMMUNITY)
Admission: EM | Admit: 2021-06-14 | Discharge: 2021-06-17 | DRG: 025 | Disposition: A | Discharge status: Home / Self Care | Payer: BLUE CROSS/BLUE SHIELD | Attending: Neurosurgery | Admitting: Neurosurgery

## 2021-06-14 ENCOUNTER — Inpatient Hospital Stay (HOSPITAL_COMMUNITY): Payer: BLUE CROSS/BLUE SHIELD

## 2021-06-14 DIAGNOSIS — G919 Hydrocephalus, unspecified: Secondary | ICD-10-CM | POA: Diagnosis present

## 2021-06-14 DIAGNOSIS — Z20822 Contact with and (suspected) exposure to covid-19: Secondary | ICD-10-CM | POA: Diagnosis present

## 2021-06-14 DIAGNOSIS — D331 Benign neoplasm of brain, infratentorial: Secondary | ICD-10-CM | POA: Diagnosis present

## 2021-06-14 DIAGNOSIS — I10 Essential (primary) hypertension: Secondary | ICD-10-CM | POA: Diagnosis present

## 2021-06-14 DIAGNOSIS — E785 Hyperlipidemia, unspecified: Secondary | ICD-10-CM | POA: Diagnosis present

## 2021-06-14 DIAGNOSIS — G935 Compression of brain: Secondary | ICD-10-CM | POA: Diagnosis present

## 2021-06-14 DIAGNOSIS — R519 Headache, unspecified: Secondary | ICD-10-CM | POA: Diagnosis present

## 2021-06-14 DIAGNOSIS — G939 Disorder of brain, unspecified: Secondary | ICD-10-CM | POA: Diagnosis present

## 2021-06-14 DIAGNOSIS — D496 Neoplasm of unspecified behavior of brain: Secondary | ICD-10-CM | POA: Diagnosis present

## 2021-06-14 DIAGNOSIS — Z9109 Other allergy status, other than to drugs and biological substances: Secondary | ICD-10-CM

## 2021-06-14 HISTORY — DX: Essential (primary) hypertension: I10

## 2021-06-14 HISTORY — DX: Hyperlipidemia, unspecified: E78.5

## 2021-06-14 LAB — RESP PANEL BY RT-PCR (FLU A&B, COVID) ARPGX2
Influenza A by PCR: NEGATIVE
Influenza B by PCR: NEGATIVE
SARS Coronavirus 2 by RT PCR: NEGATIVE

## 2021-06-14 LAB — CBC WITH DIFFERENTIAL/PLATELET
Abs Immature Granulocytes: 0.06 10*3/uL (ref 0.00–0.07)
Basophils Absolute: 0 10*3/uL (ref 0.0–0.1)
Basophils Relative: 0 %
Eosinophils Absolute: 0 10*3/uL (ref 0.0–0.5)
Eosinophils Relative: 0 %
HCT: 40.6 % (ref 36.0–46.0)
Hemoglobin: 13.8 g/dL (ref 12.0–15.0)
Immature Granulocytes: 1 %
Lymphocytes Relative: 19 %
Lymphs Abs: 1.8 10*3/uL (ref 0.7–4.0)
MCH: 26.4 pg (ref 26.0–34.0)
MCHC: 34 g/dL (ref 30.0–36.0)
MCV: 77.6 fL — ABNORMAL LOW (ref 80.0–100.0)
Monocytes Absolute: 0.4 10*3/uL (ref 0.1–1.0)
Monocytes Relative: 4 %
Neutro Abs: 6.8 10*3/uL (ref 1.7–7.7)
Neutrophils Relative %: 76 %
Platelets: 383 10*3/uL (ref 150–400)
RBC: 5.23 MIL/uL — ABNORMAL HIGH (ref 3.87–5.11)
RDW: 13.2 % (ref 11.5–15.5)
WBC: 9.1 10*3/uL (ref 4.0–10.5)
nRBC: 0 % (ref 0.0–0.2)

## 2021-06-14 LAB — COMPREHENSIVE METABOLIC PANEL
ALT: 13 U/L (ref 0–44)
AST: 20 U/L (ref 15–41)
Albumin: 4.5 g/dL (ref 3.5–5.0)
Alkaline Phosphatase: 123 U/L (ref 38–126)
Anion gap: 12 (ref 5–15)
BUN: 10 mg/dL (ref 6–20)
CO2: 25 mmol/L (ref 22–32)
Calcium: 9.5 mg/dL (ref 8.9–10.3)
Chloride: 99 mmol/L (ref 98–111)
Creatinine, Ser: 0.73 mg/dL (ref 0.44–1.00)
GFR, Estimated: >60 mL/min (ref >60)
Glucose, Bld: 122 mg/dL — ABNORMAL HIGH (ref 70–99)
Potassium: 4.3 mmol/L (ref 3.5–5.1)
Sodium: 136 mmol/L (ref 135–145)
Total Bilirubin: 0.8 mg/dL (ref 0.3–1.2)
Total Protein: 8.2 g/dL — ABNORMAL HIGH (ref 6.5–8.1)

## 2021-06-14 LAB — GLUCOSE, CAPILLARY: Glucose-Capillary: 154 mg/dL — ABNORMAL HIGH (ref 70–99)

## 2021-06-14 LAB — SURGICAL PCR SCREEN
MRSA, PCR: NEGATIVE
Staphylococcus aureus: NEGATIVE

## 2021-06-14 MED ORDER — GADOBUTROL 1 MMOL/ML IV SOLN
10.0000 mL | Freq: Once | INTRAVENOUS | Status: AC | PRN
  Administered 2021-06-14: 10 mL via INTRAVENOUS

## 2021-06-14 MED ORDER — DEXAMETHASONE SODIUM PHOSPHATE 4 MG/ML IJ SOLN
4.0000 mg | Freq: Four times a day (QID) | INTRAMUSCULAR | Status: DC
  Filled 2021-06-14: qty 1

## 2021-06-14 MED ORDER — HYDROMORPHONE HCL 1 MG/ML IJ SOLN: 0.5000 mg | INTRAMUSCULAR | Status: DC | PRN

## 2021-06-14 MED ORDER — METHOCARBAMOL 1000 MG/10ML IJ SOLN
500.0000 mg | Freq: Four times a day (QID) | INTRAVENOUS | Status: DC | PRN
  Filled 2021-06-14 (×2): qty 5

## 2021-06-14 MED ORDER — ATORVASTATIN CALCIUM 10 MG PO TABS
20.0000 mg | ORAL_TABLET | Freq: Every day | ORAL | Status: DC
  Administered 2021-06-14 – 2021-06-16 (×3): 20 mg via ORAL
  Filled 2021-06-14 (×3): qty 2

## 2021-06-14 MED ORDER — SODIUM CHLORIDE 0.9 % IV BOLUS
500.0000 mL | Freq: Once | INTRAVENOUS | Status: DC
Start: 2021-06-14 — End: 2021-06-14

## 2021-06-14 MED ORDER — DIPHENHYDRAMINE HCL 50 MG/ML IJ SOLN
25.0000 mg | Freq: Once | INTRAMUSCULAR | Status: AC
  Administered 2021-06-14: 25 mg via INTRAVENOUS
  Filled 2021-06-14: qty 1

## 2021-06-14 MED ORDER — MUPIROCIN 2 % EX OINT
1.0000 "application " | TOPICAL_OINTMENT | Freq: Two times a day (BID) | CUTANEOUS | Status: DC
  Administered 2021-06-14: 1 via NASAL
  Filled 2021-06-14: qty 22

## 2021-06-14 MED ORDER — DOCUSATE SODIUM 100 MG PO CAPS
100.0000 mg | ORAL_CAPSULE | Freq: Two times a day (BID) | ORAL | Status: DC
  Administered 2021-06-14: 100 mg via ORAL
  Filled 2021-06-14: qty 1

## 2021-06-14 MED ORDER — POLYETHYLENE GLYCOL 3350 17 G PO PACK
17.0000 g | PACK | Freq: Every day | ORAL | Status: DC | PRN
Start: 2021-06-14 — End: 2021-06-15

## 2021-06-14 MED ORDER — METOPROLOL TARTRATE 5 MG/5ML IV SOLN
5.0000 mg | Freq: Four times a day (QID) | INTRAVENOUS | Status: DC | PRN
  Administered 2021-06-14: 5 mg via INTRAVENOUS
  Filled 2021-06-14: qty 5

## 2021-06-14 MED ORDER — ACETAMINOPHEN 650 MG RE SUPP
650.0000 mg | Freq: Four times a day (QID) | RECTAL | Status: DC | PRN
Start: 2021-06-14 — End: 2021-06-15

## 2021-06-14 MED ORDER — CEFAZOLIN SODIUM-DEXTROSE 2-4 GM/100ML-% IV SOLN
2.0000 g | INTRAVENOUS | Status: AC
  Administered 2021-06-15: 09:00:00 2 g via INTRAVENOUS

## 2021-06-14 MED ORDER — HYDROCODONE-ACETAMINOPHEN 5-325 MG PO TABS: 1.0000 | ORAL_TABLET | ORAL | Status: DC | PRN

## 2021-06-14 MED ORDER — ONDANSETRON HCL 4 MG PO TABS
4.0000 mg | ORAL_TABLET | Freq: Four times a day (QID) | ORAL | Status: DC | PRN
Start: 2021-06-14 — End: 2021-06-15

## 2021-06-14 MED ORDER — ACETAMINOPHEN 325 MG PO TABS
650.0000 mg | ORAL_TABLET | Freq: Four times a day (QID) | ORAL | Status: DC | PRN
  Administered 2021-06-14: 650 mg via ORAL
  Filled 2021-06-14: qty 2

## 2021-06-14 MED ORDER — FLEET ENEMA 7-19 GM/118ML RE ENEM: 1.0000 | ENEMA | Freq: Once | RECTAL | Status: DC | PRN

## 2021-06-14 MED ORDER — BISACODYL 10 MG RE SUPP: 10.0000 mg | Freq: Every day | RECTAL | Status: DC | PRN

## 2021-06-14 MED ORDER — DEXAMETHASONE SODIUM PHOSPHATE 10 MG/ML IJ SOLN
10.0000 mg | Freq: Once | INTRAMUSCULAR | Status: AC
  Administered 2021-06-14: 10 mg via INTRAVENOUS
  Filled 2021-06-14: qty 1

## 2021-06-14 MED ORDER — ONDANSETRON HCL 4 MG/2ML IJ SOLN
4.0000 mg | Freq: Four times a day (QID) | INTRAMUSCULAR | Status: DC | PRN
Start: 2021-06-14 — End: 2021-06-15

## 2021-06-14 MED ORDER — METOPROLOL TARTRATE 50 MG PO TABS
50.0000 mg | ORAL_TABLET | Freq: Two times a day (BID) | ORAL | Status: DC
  Administered 2021-06-14 – 2021-06-17 (×5): 50 mg via ORAL
  Filled 2021-06-14 (×5): qty 1

## 2021-06-14 MED ORDER — PROCHLORPERAZINE EDISYLATE 10 MG/2ML IJ SOLN
10.0000 mg | Freq: Once | INTRAMUSCULAR | Status: AC
  Administered 2021-06-14: 10 mg via INTRAVENOUS
  Filled 2021-06-14: qty 2

## 2021-06-14 MED ORDER — SODIUM CHLORIDE 0.9 % IV SOLN: INTRAVENOUS | Status: DC

## 2021-06-14 NOTE — H&P (Signed)
Nicole Lyons is an 56 y.o. female.   Chief Complaint: Headache HPI: Nicole Lyons is a 56 year old female with a history significant for hyperlipidemia. She presented to the [facility] Emergency Department on 06/14/2021 with a complaint of persistent headaches. She reports a history of mild headaches, but this headache is more severe and has been constant since Sunday. She reports associated nausea, but no vomiting. She also reports experiencing tinnitus recently. She denies visual changes, photophobia, balance issues, loss of strength/sensation, or changes in speech. She is very active, participating in dance and spin classes and has not noticed any loss of balance or coordination. She denies pain at present.  Past Medical History:  Diagnosis Date   Hyperlipidemia    Hypertension     History reviewed. No pertinent surgical history.  No family history on file. Social History:  reports that she has never smoked. She has never used smokeless tobacco. She reports that she does not currently use alcohol. She reports that she does not currently use drugs.  Allergies:  Allergies  Allergen Reactions   Tioconazole Other (See Comments)    Vaginal Irritation     (Not in a hospital admission)   Results for orders placed or performed during the hospital encounter of 06/14/21 (from the past 48 hour(s))  CBC with Differential     Status: Abnormal   Collection Time: 06/14/21 11:55 AM  Result Value Ref Range   WBC 9.1 4.0 - 10.5 K/uL   RBC 5.23 (H) 3.87 - 5.11 MIL/uL   Hemoglobin 13.8 12.0 - 15.0 g/dL   HCT 40.6 36.0 - 46.0 %   MCV 77.6 (L) 80.0 - 100.0 fL   MCH 26.4 26.0 - 34.0 pg   MCHC 34.0 30.0 - 36.0 g/dL   RDW 13.2 11.5 - 15.5 %   Platelets 383 150 - 400 K/uL   nRBC 0.0 0.0 - 0.2 %   Neutrophils Relative % 76 %   Neutro Abs 6.8 1.7 - 7.7 K/uL   Lymphocytes Relative 19 %   Lymphs Abs 1.8 0.7 - 4.0 K/uL   Monocytes Relative 4 %   Monocytes Absolute 0.4 0.1 - 1.0 K/uL   Eosinophils  Relative 0 %   Eosinophils Absolute 0.0 0.0 - 0.5 K/uL   Basophils Relative 0 %   Basophils Absolute 0.0 0.0 - 0.1 K/uL   Immature Granulocytes 1 %   Abs Immature Granulocytes 0.06 0.00 - 0.07 K/uL    Comment: Performed at [facility], [address] [place], [place] [address]   CT HEAD WO CONTRAST (5MM)  Result Date: 06/14/2021 CLINICAL DATA:  Severe headache. EXAM: CT HEAD WITHOUT CONTRAST TECHNIQUE: Contiguous axial images were obtained from the base of the skull through the vertex without intravenous contrast. RADIATION DOSE REDUCTION: This exam was performed according to the departmental dose-optimization program which includes automated exposure control, adjustment of the mA and/or kV according to patient size and/or use of iterative reconstruction technique. COMPARISON:  None. FINDINGS: Brain: Large mass lesion in the posterior fossa on the left. This appears extra-axial. The mass is predominantly low density with strands of soft tissue density coursing throughout the mass. Small calcifications. No definite hemorrhage. The mass measures approximately 7.6 x 5.2 cm on axial images. The mass is displacing the tentorium superiorly. There is marked bony destruction of the left occipital bone which is thinned. Tumor extends into the left occipital bone. The left tentorium insertion appears displaced medially likely due to a nonacute  pathologic fracture. There is minimal vasogenic edema in the left cerebellum. The fourth ventricle is significantly displaced to the right and compressed. There is mild obstructive hydrocephalus with dilatation of the third and lateral ventricles. No other mass lesion. 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 Vascular: Negative for hyperdense vessel Skull: Marked thinning and aggressive destruction of the left occipital bone due to tumor. There appears to be a nonacute fracture of the tentorium insertion on the left. No remote calvarial lesion.  Sinuses/Orbits: Paranasal sinuses clear. Mastoid and middle ear clear bilaterally Other: None IMPRESSION: Large mass lesion left posterior fossa appears extra-axial. There is marked mass-effect on the cerebellum with displacement of the fourth ventricle and mild obstructive hydrocephalus. There is aggressive bony destruction of the left occipital bone. Nonacute fracture of the tentorial insertion on the left. This mass lesion appears to be aggressive and extra-axial. This could be a malignant meningioma or hemangiopericytoma. Metastatic disease to bone or dura less likely. MRI brain without and with contrast recommended. These results were called by telephone at the time of interpretation on 06/14/2021 at 11:14 am to provider [facility] , who verbally acknowledged these results. Electronically Signed   By: Franchot Gallo M.D.   On: 06/14/2021 11:16   MR Brain W and Wo Contrast  Result Date: 06/14/2021 CLINICAL DATA:  Abnormal CT with intracranial mass EXAM: MRI HEAD WITHOUT AND WITH CONTRAST TECHNIQUE: Multiplanar, multiecho pulse sequences of the brain and surrounding structures were obtained without and with intravenous contrast. CONTRAST:  21m GADAVIST GADOBUTROL 1 MMOL/ML IV SOLN COMPARISON:  Correlation made with same day CT head FINDINGS: Brain: Large heterogeneous, extra-axial mass of the left posterior fossa with areas of intrinsic T1 shortening and susceptibility that likely reflect blood products. Corresponding mildly reduced diffusion likely reflects hypercellularity. There is minimal associated edema. However, size of the mass results in significant mass effect including effacement of the fourth ventricle, tonsillar herniation, and ascending transtentorial herniation. Resulting obstructive hydrocephalus with periventricular T2 hyperintensity likely reflecting interstitial edema. There is remodeling and extension into the adjacent calvarium with frank disruption of inner and outer tables as seen on the  prior CT. No additional mass or abnormal enhancement.  No acute infarction. Vascular: Major vessel flow voids at the skull base are preserved. Skull and upper cervical spine: As above. Otherwise normal marrow signal is preserved. Sinuses/Orbits: Paranasal sinuses are aerated. Orbits are unremarkable. Other: Sella is unremarkable.  Mastoid air cells are clear. IMPRESSION: Large left posterior fossa extra-axial mass effacing the fourth ventricle with obstructive hydrocephalus and causing tonsillar and ascending transtentorial herniation. Remodeling and extension into the adjacent calvarium with frank disruption of inner and outer tables as seen on the CT. Despite size, there is minimal associated edema. Differential includes higher grade meningioma and hemangiopericytoma. However, there is no evidence of brain invasion. Metastatic disease much less likely. Electronically Signed   By: PMacy MisM.D.   On: 06/14/2021 14:41    Review of Systems  Constitutional: Negative.   HENT:  Positive for tinnitus. Negative for congestion.        Hx of a left tooth extraction due to infection.  Eyes: Negative.   Respiratory: Negative.    Cardiovascular: Negative.   Gastrointestinal:  Positive for nausea. Negative for abdominal pain, constipation, diarrhea and vomiting.  Endocrine: Negative.   Genitourinary: Negative.   Skin: Negative.   Allergic/Immunologic: Negative.   Neurological:  Positive for dizziness and headaches. Negative for tremors, seizures, syncope, facial asymmetry, speech  difficulty, weakness, light-headedness and numbness.  Hematological: Negative.   Psychiatric/Behavioral: Negative.     Blood pressure (!) 152/99, pulse 77, temperature 97.7 F (36.5 C), temperature source Oral, resp. rate 14, SpO2 95 %. Physical Exam Constitutional:      Appearance: She is well-developed.  HENT:     Head: Normocephalic and atraumatic.     Mouth/Throat:     Mouth: Mucous membranes are moist.     Pharynx:  Oropharynx is clear.  Eyes:     General: No visual field deficit.    Extraocular Movements: Extraocular movements intact.     Pupils: Pupils are equal, round, and reactive to light.  Cardiovascular:     Rate and Rhythm: Normal rate and regular rhythm.     Heart sounds: Normal heart sounds.  Pulmonary:     Effort: Pulmonary effort is normal. No respiratory distress.  Musculoskeletal:        General: Normal range of motion.     Cervical back: Normal range of motion and neck supple.  Skin:    General: Skin is warm and dry.     Capillary Refill: Capillary refill takes less than 2 seconds.  Neurological:     Mental Status: She is alert.     GCS: GCS eye subscore is 4. GCS verbal subscore is 5. GCS motor subscore is 6.     Cranial Nerves: No cranial nerve deficit, dysarthria or facial asymmetry.     Sensory: No sensory deficit.     Motor: No weakness.     Coordination: Coordination normal.     Gait: Gait normal.  Psychiatric:        Mood and Affect: Mood normal.        Speech: Speech normal.        Behavior: Behavior normal.     Assessment/Plan Ms. Matusik presented to [facility] ED on the morning of 06/14/2021, with a complaint of a severe headache. 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 MRI scan with [facility] protocol has been ordered. IV decadron 10 mg now followed by 4 mg every 6 hours also ordered. Dr. Zada Finders will be assuming care of this patient to discuss surgical options once MRI complete.  Patricia Nettle, NP 06/14/2021, 12:32 PM

## 2021-06-14 NOTE — ED Provider Triage Note (Signed)
Emergency Medicine Provider Triage Evaluation Note ? ?Nicole Lyons , a 56 y.o. female  was evaluated in triage.  Pt complains of headache since 06/11/2021.  She describes it as a squeezing sensation around her head.  She has tried a decongestant and a Goody powder without much improvement in her symptoms.  She has got headaches in the past but states that this feels much worse compared to her typical headaches.  She denies any vision changes but does report associated nausea.  Denies any vomiting, numbness, weakness, injuries.  She is intermittently compliant with her antihypertensive but did take it today ? ?Review of Systems  ?Positive: Headache, nausea ?Negative: Vomiting, numbness, weakness ? ?Physical Exam  ?BP (!) 165/119 (BP Location: Left Arm)   Pulse 87   Temp 97.7 ?F (36.5 ?C) (Oral)   Resp 16   SpO2 99%  ?Gen:   Awake, no distress   ?Resp:  Normal effort  ?MSK:   Moves extremities without difficulty  ?Other:  Pupils equal reactive to light.  No facial asymmetry.  Strength 5/5 in bilateral upper and lower extremities.  Sensation intact to light touch of face, bilateral upper and lower extremities ? ?Medical Decision Making  ?Medically screening exam initiated at 10:08 AM.  Appropriate orders placed.  LAURENA VALKO was informed that the remainder of the evaluation will be completed by another provider, this initial triage assessment does not replace that evaluation, and the importance of remaining in the ED until their evaluation is complete. ? ?CT ordered due to age and new onset headache ?  ?Delia Heady, PA-C ?06/14/21 1010 ? ?

## 2021-06-14 NOTE — ED Provider Notes (Signed)
[facility] EMERGENCY DEPARTMENT Provider Note   CSN: [id number] Arrival date & time: 06/14/21  8016     History  Chief Complaint  Nicole Lyons presents with   Headache   Nausea    Nicole Lyons is a 56 y.o. female.  The history is provided by the Nicole Lyons and medical records. No language interpreter was used.  Headache Pain location:  Generalized Quality:  Dull Radiates to:  Does not radiate Severity currently:  10/10 (12/10) Severity at highest:  10/10 (12/10) Onset quality:  Gradual Duration:  4 days Timing:  Constant Progression:  Unchanged Chronicity:  New Similar to prior headaches: no   Context: not exposure to bright light   Relieved by:  Nothing Worsened by:  Nothing Ineffective treatments:  None tried Associated symptoms: no abdominal pain, no back pain, no blurred vision, no congestion, no cough, no diarrhea, no dizziness, no fatigue, no fever, no nausea, no neck pain, no neck stiffness, no numbness, no paresthesias, no photophobia, no seizures, no sinus pressure, no tingling, no visual change, no vomiting and no weakness       Home Medications Prior to Admission medications   Not on File      Allergies    Nicole Lyons has no allergy information on record.    Review of Systems   Review of Systems  Constitutional:  Negative for chills, fatigue and fever.  HENT:  Negative for congestion and sinus pressure.   Eyes:  Negative for blurred vision and photophobia.  Respiratory:  Negative for cough, chest tightness, shortness of breath and wheezing.   Cardiovascular:  Negative for chest pain, palpitations and leg swelling.  Gastrointestinal:  Negative for abdominal pain, constipation, diarrhea, nausea and vomiting.  Genitourinary:  Negative for dysuria, flank pain and frequency.  Musculoskeletal:  Negative for back pain, neck pain and neck stiffness.  Skin:  Negative for rash.  Neurological:  Positive for headaches. Negative for dizziness,  seizures, syncope, facial asymmetry, speech difficulty, weakness, light-headedness, numbness and paresthesias.  Psychiatric/Behavioral:  Negative for agitation and confusion.   All other systems reviewed and are negative.  Physical Exam Updated Vital Signs BP (!) 165/119 (BP Location: Left Arm)    Pulse 87    Temp 97.7 F (36.5 C) (Oral)    Resp 16    SpO2 99%  Physical Exam Vitals and nursing note reviewed.  Constitutional:      General: Nicole Lyons is not in acute distress.    Appearance: Nicole Lyons is well-developed.  HENT:     Head: Normocephalic and atraumatic.  Eyes:     Conjunctiva/sclera: Conjunctivae normal.  Cardiovascular:     Rate and Rhythm: Normal rate and regular rhythm.     Heart sounds: No murmur heard. Pulmonary:     Effort: Pulmonary effort is normal. No respiratory distress.     Breath sounds: Normal breath sounds.  Abdominal:     Palpations: Abdomen is soft.     Tenderness: There is no abdominal tenderness.  Musculoskeletal:        General: No swelling.     Cervical back: Neck supple.  Skin:    General: Skin is warm and dry.     Capillary Refill: Capillary refill takes less than 2 seconds.  Neurological:     Mental Status: Nicole Lyons is alert. Mental status is at baseline.     GCS: GCS eye subscore is 4. GCS verbal subscore is 5. GCS motor subscore is 6.     Cranial Nerves: No cranial  nerve deficit, dysarthria or facial asymmetry.     Sensory: No sensory deficit.     Motor: No tremor or abnormal muscle tone.     Coordination: Finger-Nose-Finger Test normal.     Comments: No focal neurologic deficits initially on exam.  Psychiatric:        Mood and Affect: Mood normal.    ED Results / Procedures / Treatments   Labs (all labs ordered are listed, but only abnormal results are displayed) Labs Reviewed  COMPREHENSIVE METABOLIC PANEL - Abnormal; Notable for the following components:      Result Value   Glucose, Bld 122 (*)    Total Protein 8.2 (*)    All other components  within normal limits  CBC WITH DIFFERENTIAL/PLATELET - Abnormal; Notable for the following components:   RBC 5.23 (*)    MCV 77.6 (*)    All other components within normal limits  RESP PANEL BY RT-PCR (FLU A&B, COVID) [id number]    EKG None  Radiology CT HEAD WO CONTRAST (5MM)  Result Date: 06/14/2021 CLINICAL DATA:  Severe headache. EXAM: CT HEAD WITHOUT CONTRAST TECHNIQUE: Contiguous axial images were obtained from the base of the skull through the vertex without intravenous contrast. RADIATION DOSE REDUCTION: This exam was performed according to the departmental dose-optimization program which includes automated exposure control, adjustment of the mA and/or kV according to Nicole Lyons size and/or use of iterative reconstruction technique. COMPARISON:  None. FINDINGS: Brain: Large mass lesion in the posterior fossa on the left. This appears extra-axial. The mass is predominantly low density with strands of soft tissue density coursing throughout the mass. Small calcifications. No definite hemorrhage. The mass measures approximately 7.6 x 5.2 cm on axial images. The mass is displacing the tentorium superiorly. There is marked bony destruction of the left occipital bone which is thinned. Tumor extends into the left occipital bone. The left tentorium insertion appears displaced medially likely due to a nonacute pathologic fracture. There is minimal vasogenic edema in the left cerebellum. The fourth ventricle is significantly displaced to the right and compressed. There is mild obstructive hydrocephalus with dilatation of the third and lateral ventricles. No other mass lesion. 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 Vascular: Negative for hyperdense vessel Skull: Marked thinning and aggressive destruction of the left occipital bone due to tumor. There appears to be a nonacute fracture of the tentorium insertion on the left. No remote calvarial lesion. Sinuses/Orbits: Paranasal sinuses clear.  Mastoid and middle ear clear bilaterally Other: None IMPRESSION: Large mass lesion left posterior fossa appears extra-axial. There is marked mass-effect on the cerebellum with displacement of the fourth ventricle and mild obstructive hydrocephalus. There is aggressive bony destruction of the left occipital bone. Nonacute fracture of the tentorial insertion on the left. This mass lesion appears to be aggressive and extra-axial. This could be a malignant meningioma or hemangiopericytoma. Metastatic disease to bone or dura less likely. MRI brain without and with contrast recommended. These results were called by telephone at the time of interpretation on 06/14/2021 at 11:14 am to provider [facility] , who verbally acknowledged these results. Electronically Signed   By: Franchot Gallo M.D.   On: 06/14/2021 11:16    Procedures Procedures    Medications Ordered in ED Medications  dexamethasone (DECADRON) injection 10 mg (has no administration in time range)  prochlorperazine (COMPAZINE) injection 10 mg (10 mg Intravenous Given 06/14/21 1155)  diphenhydrAMINE (BENADRYL) injection 25 mg (25 mg Intravenous Given 06/14/21 1156)  gadobutrol (GADAVIST)  1 MMOL/ML injection 10 mL (10 mLs Intravenous Contrast Given 06/14/21 1418)    ED Course/ Medical Decision Making/ A&P                           Medical Decision Making Risk Prescription drug management. Decision regarding hospitalization.    SHYLEE DURRETT is a 56 y.o. female with past medical history significant for hypertension who presents with headache for the last 4 days.  Nicole Lyons reports some nausea but no vomiting.  Denies any head trauma fevers, chills, neck pain, neck stiffness.  Denies any neurologic complaints with no numbness, tingling, weakness of extremities.  Denies any vision changes, speech difficulties, or difficulty with ambulation.  No history of this.  Nicole Lyons is not a person to get headaches frequently but reports the headache is 12 out of  10.  On exam, lungs clear and chest nontender.  Abdomen nontender.  Pupils are symmetric and reactive with normal extraocular movements.  Symmetric smile.  Clear speech.  Normal sensation and strength in extremities.  Normal finger-nose-finger testing bilaterally.  Good pulses in extremities.  Nicole Lyons resting comfortably but still having some nausea and headache.  Nicole Lyons was seen in triage and had a CT head ordered initially.  CT head shows a large mass in Nicole Lyons head likely causing Nicole Lyons symptoms.  MRI with and without was recommended.  Nicole Lyons was informed of the findings on Nicole Lyons head CT that there is abnormal tissue that will need further imaging today.  We will also give Nicole Lyons headache cocktail with some Compazine and Benadryl and Nicole Lyons will get screening labs for the MRI.  We will also get a COVID swab as I anticipate Nicole Lyons will need admission to the hospital.  We will get MRI and then speak to neurosurgery to discuss a plan.  12:13 PM Spoke to Dr. Arnoldo Morale with neurosurgery.  They agreed with giving 10 of Decadron and they will come see Nicole Lyons.  They feel that they will likely admit for further management.  They agreed with the MRI with and without of the brain which is already ordered.  Anticipate admission and neurosurgery after work-up is completed.         Final Clinical Impression(s) / ED Diagnoses Final diagnoses:  Bad headache  Brain lesion     Clinical Impression: 1. Bad headache   2. Brain lesion     Disposition: Admit  This note was prepared with assistance of Dragon voice recognition software. Occasional wrong-word or sound-a-like substitutions may have occurred due to the inherent limitations of voice recognition software.      Zaylee Cornia, Gwenyth Allegra, MD 06/14/21 [phone]

## 2021-06-14 NOTE — Progress Notes (Signed)
Pt arrived to unit from ED at 1715. Pt ambulatory, a&ox4, vitals WNL, but hypertensive - IV Lopressor given. Dr. Zada Finders later at the bedside - plans for surgery at 0730 tomorrow. NPO at midnight. Informed consent signed.  ? ?Justice Rocher, RN ? ?

## 2021-06-14 NOTE — ED Notes (Signed)
Pt transported to MRI via stretcher at this time.  °

## 2021-06-14 NOTE — ED Notes (Signed)
Pt gone to MRI 

## 2021-06-14 NOTE — ED Triage Notes (Signed)
Pt. Stated, ive had a severe headache since Sunday ?

## 2021-06-15 ENCOUNTER — Inpatient Hospital Stay (HOSPITAL_COMMUNITY): Payer: BLUE CROSS/BLUE SHIELD | Admitting: Certified Registered"

## 2021-06-15 ENCOUNTER — Inpatient Hospital Stay (HOSPITAL_COMMUNITY)
Admission: EM | Disposition: A | Discharge status: Home / Self Care | Payer: Self-pay | Source: Home / Self Care | Attending: Neurological Surgery

## 2021-06-15 ENCOUNTER — Inpatient Hospital Stay (HOSPITAL_COMMUNITY): Payer: BLUE CROSS/BLUE SHIELD

## 2021-06-15 ENCOUNTER — Other Ambulatory Visit: Payer: Self-pay

## 2021-06-15 DIAGNOSIS — D496 Neoplasm of unspecified behavior of brain: Secondary | ICD-10-CM | POA: Diagnosis present

## 2021-06-15 HISTORY — PX: APPLICATION OF CRANIAL NAVIGATION: SHX6578

## 2021-06-15 HISTORY — PX: CRANIOTOMY: SHX93

## 2021-06-15 LAB — TYPE AND SCREEN
ABO/RH(D): B POS
Antibody Screen: NEGATIVE

## 2021-06-15 LAB — CREATININE, SERUM
Creatinine, Ser: 0.88 mg/dL (ref 0.44–1.00)
GFR, Estimated: >60 mL/min (ref >60)

## 2021-06-15 LAB — CBC
HCT: 38.1 % (ref 36.0–46.0)
Hemoglobin: 13.3 g/dL (ref 12.0–15.0)
MCH: 26.9 pg (ref 26.0–34.0)
MCHC: 34.9 g/dL (ref 30.0–36.0)
MCV: 77 fL — ABNORMAL LOW (ref 80.0–100.0)
Platelets: 409 10*3/uL — ABNORMAL HIGH (ref 150–400)
RBC: 4.95 MIL/uL (ref 3.87–5.11)
RDW: 13.3 % (ref 11.5–15.5)
WBC: 9.9 10*3/uL (ref 4.0–10.5)
nRBC: 0 % (ref 0.0–0.2)

## 2021-06-15 LAB — HIV ANTIBODY (ROUTINE TESTING W REFLEX): HIV Screen 4th Generation wRfx: NONREACTIVE

## 2021-06-15 SURGERY — CRANIOTOMY TUMOR EXCISION
Anesthesia: General | Site: Head | Laterality: Left

## 2021-06-15 MED ORDER — MIDAZOLAM HCL 2 MG/2ML IJ SOLN
INTRAMUSCULAR | Status: AC
  Filled 2021-06-15: qty 2

## 2021-06-15 MED ORDER — POLYETHYLENE GLYCOL 3350 17 G PO PACK: 17.0000 g | PACK | Freq: Every day | ORAL | Status: DC | PRN

## 2021-06-15 MED ORDER — ACETAMINOPHEN 10 MG/ML IV SOLN: 1000.0000 mg | Freq: Once | INTRAVENOUS | Status: DC | PRN

## 2021-06-15 MED ORDER — GELATIN ABSORBABLE 100 EX MISC: CUTANEOUS | Status: DC | PRN

## 2021-06-15 MED ORDER — LABETALOL HCL 5 MG/ML IV SOLN
10.0000 mg | INTRAVENOUS | Status: DC | PRN
  Administered 2021-06-15: 20:00:00 20 mg via INTRAVENOUS
  Administered 2021-06-16: 40 mg via INTRAVENOUS
  Filled 2021-06-15: qty 4
  Filled 2021-06-15: qty 8

## 2021-06-15 MED ORDER — ACETAMINOPHEN 325 MG PO TABS
650.0000 mg | ORAL_TABLET | ORAL | Status: DC | PRN
Start: 2021-06-15 — End: 2021-06-17
  Administered 2021-06-16 – 2021-06-17 (×2): 650 mg via ORAL
  Filled 2021-06-15 (×2): qty 2

## 2021-06-15 MED ORDER — METOPROLOL TARTRATE 5 MG/5ML IV SOLN
INTRAVENOUS | Status: DC | PRN
  Administered 2021-06-15: 2.5 mg via INTRAVENOUS

## 2021-06-15 MED ORDER — PHENYLEPHRINE HCL-NACL 20-0.9 MG/250ML-% IV SOLN
INTRAVENOUS | Status: DC | PRN
  Administered 2021-06-15: 30 ug/min via INTRAVENOUS

## 2021-06-15 MED ORDER — SUGAMMADEX SODIUM 200 MG/2ML IV SOLN
INTRAVENOUS | Status: DC | PRN
Start: 2021-06-15 — End: 2021-06-15
  Administered 2021-06-15: 200 mg via INTRAVENOUS

## 2021-06-15 MED ORDER — ONDANSETRON HCL 4 MG/2ML IJ SOLN
INTRAMUSCULAR | Status: DC | PRN
  Administered 2021-06-15: 4 mg via INTRAVENOUS

## 2021-06-15 MED ORDER — SODIUM CHLORIDE 0.9 % IV SOLN: INTRAVENOUS | Status: DC | PRN

## 2021-06-15 MED ORDER — BACITRACIN ZINC 500 UNIT/GM EX OINT
TOPICAL_OINTMENT | CUTANEOUS | Status: AC
  Filled 2021-06-15: qty 28.35

## 2021-06-15 MED ORDER — ACETAMINOPHEN 10 MG/ML IV SOLN
INTRAVENOUS | Status: AC
  Filled 2021-06-15: qty 100

## 2021-06-15 MED ORDER — PROPOFOL 10 MG/ML IV BOLUS
INTRAVENOUS | Status: AC
  Filled 2021-06-15: qty 20

## 2021-06-15 MED ORDER — THROMBIN 20000 UNITS EX SOLR
CUTANEOUS | Status: AC
  Filled 2021-06-15: qty 20000

## 2021-06-15 MED ORDER — PROPOFOL 10 MG/ML IV BOLUS
INTRAVENOUS | Status: DC | PRN
  Administered 2021-06-15: 50 mg via INTRAVENOUS
  Administered 2021-06-15: 200 mg via INTRAVENOUS

## 2021-06-15 MED ORDER — OXYCODONE HCL 5 MG PO TABS: 5.0000 mg | ORAL_TABLET | Freq: Once | ORAL | Status: DC | PRN

## 2021-06-15 MED ORDER — THROMBIN 5000 UNITS EX SOLR: OROMUCOSAL | Status: DC | PRN

## 2021-06-15 MED ORDER — THROMBIN 5000 UNITS EX SOLR
CUTANEOUS | Status: AC
  Filled 2021-06-15: qty 5000

## 2021-06-15 MED ORDER — LIDOCAINE-EPINEPHRINE 1 %-1:100000 IJ SOLN
INTRAMUSCULAR | Status: AC
  Filled 2021-06-15: qty 1

## 2021-06-15 MED ORDER — HEMOSTATIC AGENTS (NO CHARGE) OPTIME
TOPICAL | Status: DC | PRN
  Administered 2021-06-15: 1 via TOPICAL

## 2021-06-15 MED ORDER — ACETAMINOPHEN 10 MG/ML IV SOLN
INTRAVENOUS | Status: DC | PRN
  Administered 2021-06-15: 1000 mg via INTRAVENOUS

## 2021-06-15 MED ORDER — LORAZEPAM 2 MG/ML IJ SOLN
1.0000 mg | Freq: Once | INTRAMUSCULAR | Status: AC | PRN
  Administered 2021-06-15: 17:00:00 1 mg via INTRAVENOUS
  Filled 2021-06-15: qty 1

## 2021-06-15 MED ORDER — GADOBUTROL 1 MMOL/ML IV SOLN
9.0000 mL | Freq: Once | INTRAVENOUS | Status: AC | PRN
  Administered 2021-06-15: 18:00:00 9 mL via INTRAVENOUS

## 2021-06-15 MED ORDER — FENTANYL CITRATE (PF) 250 MCG/5ML IJ SOLN
INTRAMUSCULAR | Status: AC
  Filled 2021-06-15: qty 5

## 2021-06-15 MED ORDER — HYDROCODONE-ACETAMINOPHEN 5-325 MG PO TABS
1.0000 | ORAL_TABLET | ORAL | Status: DC | PRN
  Administered 2021-06-15 – 2021-06-17 (×8): 1 via ORAL
  Filled 2021-06-15 (×8): qty 1

## 2021-06-15 MED ORDER — ACETAMINOPHEN 650 MG RE SUPP: 650.0000 mg | RECTAL | Status: DC | PRN

## 2021-06-15 MED ORDER — FENTANYL CITRATE (PF) 250 MCG/5ML IJ SOLN
INTRAMUSCULAR | Status: DC | PRN
  Administered 2021-06-15: 150 ug via INTRAVENOUS

## 2021-06-15 MED ORDER — PROMETHAZINE HCL 25 MG PO TABS: 12.5000 mg | ORAL_TABLET | ORAL | Status: DC | PRN

## 2021-06-15 MED ORDER — ONDANSETRON HCL 4 MG PO TABS: 4.0000 mg | ORAL_TABLET | ORAL | Status: DC | PRN

## 2021-06-15 MED ORDER — ALBUMIN HUMAN 5 % IV SOLN: INTRAVENOUS | Status: DC | PRN

## 2021-06-15 MED ORDER — OXYCODONE HCL 5 MG/5ML PO SOLN: 5.0000 mg | Freq: Once | ORAL | Status: DC | PRN

## 2021-06-15 MED ORDER — MIDAZOLAM HCL 2 MG/2ML IJ SOLN
INTRAMUSCULAR | Status: DC | PRN
  Administered 2021-06-15: 2 mg via INTRAVENOUS

## 2021-06-15 MED ORDER — ACETAMINOPHEN 500 MG PO TABS: 1000.0000 mg | ORAL_TABLET | Freq: Once | ORAL | Status: DC | PRN

## 2021-06-15 MED ORDER — ONDANSETRON HCL 4 MG/2ML IJ SOLN: 4.0000 mg | INTRAMUSCULAR | Status: DC | PRN

## 2021-06-15 MED ORDER — LIDOCAINE-EPINEPHRINE 1 %-1:100000 IJ SOLN
INTRAMUSCULAR | Status: DC | PRN
  Administered 2021-06-15: 10 mL

## 2021-06-15 MED ORDER — CHLORHEXIDINE GLUCONATE CLOTH 2 % EX PADS
6.0000 | MEDICATED_PAD | Freq: Every day | CUTANEOUS | Status: DC
  Administered 2021-06-15 – 2021-06-16 (×2): 6 via TOPICAL

## 2021-06-15 MED ORDER — DOCUSATE SODIUM 100 MG PO CAPS
100.0000 mg | ORAL_CAPSULE | Freq: Two times a day (BID) | ORAL | Status: DC
  Administered 2021-06-15 – 2021-06-17 (×5): 100 mg via ORAL
  Filled 2021-06-15 (×5): qty 1

## 2021-06-15 MED ORDER — 0.9 % SODIUM CHLORIDE (POUR BTL) OPTIME
TOPICAL | Status: DC | PRN
  Administered 2021-06-15: 10:00:00 3000 mL

## 2021-06-15 MED ORDER — HYDROMORPHONE HCL 1 MG/ML IJ SOLN
0.5000 mg | INTRAMUSCULAR | Status: DC | PRN
  Administered 2021-06-15 (×2): 0.5 mg via INTRAVENOUS
  Filled 2021-06-15 (×2): qty 1

## 2021-06-15 MED ORDER — LIDOCAINE 2% (20 MG/ML) 5 ML SYRINGE
INTRAMUSCULAR | Status: DC | PRN
Start: 2021-06-15 — End: 2021-06-15
  Administered 2021-06-15: 60 mg via INTRAVENOUS

## 2021-06-15 MED ORDER — ROCURONIUM BROMIDE 10 MG/ML (PF) SYRINGE
PREFILLED_SYRINGE | INTRAVENOUS | Status: DC | PRN
  Administered 2021-06-15: 50 mg via INTRAVENOUS
  Administered 2021-06-15: 100 mg via INTRAVENOUS

## 2021-06-15 MED ORDER — DEXAMETHASONE SODIUM PHOSPHATE 10 MG/ML IJ SOLN
INTRAMUSCULAR | Status: DC | PRN
Start: 2021-06-15 — End: 2021-06-15
  Administered 2021-06-15: 10 mg via INTRAVENOUS

## 2021-06-15 MED ORDER — BACITRACIN ZINC 500 UNIT/GM EX OINT
TOPICAL_OINTMENT | CUTANEOUS | Status: DC | PRN
  Administered 2021-06-15: 1 via TOPICAL

## 2021-06-15 MED ORDER — FENTANYL CITRATE (PF) 100 MCG/2ML IJ SOLN: 25.0000 ug | INTRAMUSCULAR | Status: DC | PRN

## 2021-06-15 MED ORDER — ACETAMINOPHEN 160 MG/5ML PO SOLN: 1000.0000 mg | Freq: Once | ORAL | Status: DC | PRN

## 2021-06-15 MED ORDER — HEPARIN SODIUM (PORCINE) 5000 UNIT/ML IJ SOLN
5000.0000 [IU] | Freq: Three times a day (TID) | INTRAMUSCULAR | Status: DC
  Administered 2021-06-17: 5000 [IU] via SUBCUTANEOUS
  Filled 2021-06-15: qty 1

## 2021-06-15 MED ORDER — SODIUM CHLORIDE 0.9 % IV SOLN
0.1500 ug/kg/min | INTRAVENOUS | Status: AC
  Administered 2021-06-15: 08:00:00 .2 ug/kg/min via INTRAVENOUS
  Filled 2021-06-15: qty 5000

## 2021-06-15 MED ORDER — PHENYLEPHRINE 40 MCG/ML (10ML) SYRINGE FOR IV PUSH (FOR BLOOD PRESSURE SUPPORT)
PREFILLED_SYRINGE | INTRAVENOUS | Status: DC | PRN
  Administered 2021-06-15: 80 ug via INTRAVENOUS
  Administered 2021-06-15: 120 ug via INTRAVENOUS
  Administered 2021-06-15: 80 ug via INTRAVENOUS

## 2021-06-15 MED ORDER — METHOCARBAMOL 1000 MG/10ML IJ SOLN
500.0000 mg | Freq: Four times a day (QID) | INTRAVENOUS | Status: DC | PRN
  Administered 2021-06-15 – 2021-06-16 (×2): 500 mg via INTRAVENOUS
  Filled 2021-06-15: qty 5
  Filled 2021-06-15 (×2): qty 500

## 2021-06-15 MED ORDER — CEFAZOLIN SODIUM-DEXTROSE 2-4 GM/100ML-% IV SOLN
2.0000 g | Freq: Three times a day (TID) | INTRAVENOUS | Status: AC
  Administered 2021-06-15 (×2): 2 g via INTRAVENOUS
  Filled 2021-06-15 (×2): qty 100

## 2021-06-15 SURGICAL SUPPLY — 78 items
BAG COUNTER SPONGE SURGICOUNT (BAG) ×5 IMPLANT
BAG SPNG CNTER NS LX DISP (BAG) ×4
BAND INSRT 18 STRL LF DISP RB (MISCELLANEOUS)
BAND RUBBER #18 3X1/16 STRL (MISCELLANEOUS) IMPLANT
BLADE CLIPPER SURG (BLADE) ×3 IMPLANT
BUR ACORN 9.0 PRECISION (BURR) ×3 IMPLANT
BUR SPIRAL ROUTER 2.3 (BUR) ×3 IMPLANT
CANISTER SUCT 3000ML PPV (MISCELLANEOUS) ×6 IMPLANT
CNTNR URN SCR LID CUP LEK RST (MISCELLANEOUS) ×2 IMPLANT
CONT SPEC 4OZ STRL OR WHT (MISCELLANEOUS) ×3
COVER BURR HOLE UNIV 10 (Orthopedic Implant) ×2 IMPLANT
COVER MAYO STAND STRL (DRAPES) IMPLANT
DRAPE HALF SHEET 40X57 (DRAPES) ×3 IMPLANT
DRAPE MICROSCOPE LEICA (MISCELLANEOUS) IMPLANT
DRAPE NEUROLOGICAL W/INCISE (DRAPES) ×3 IMPLANT
DRAPE STERI IOBAN 125X83 (DRAPES) IMPLANT
DRAPE SURG 17X23 STRL (DRAPES) IMPLANT
DRAPE WARM FLUID 44X44 (DRAPES) ×3 IMPLANT
DRSG ADAPTIC 3X8 NADH LF (GAUZE/BANDAGES/DRESSINGS) IMPLANT
DRSG TELFA 3X8 NADH (GAUZE/BANDAGES/DRESSINGS) IMPLANT
DURAPREP 6ML APPLICATOR 50/CS (WOUND CARE) ×3 IMPLANT
ELECT REM PT RETURN 9FT ADLT (ELECTROSURGICAL) ×3
ELECTRODE REM PT RTRN 9FT ADLT (ELECTROSURGICAL) ×2 IMPLANT
EVACUATOR 1/8 PVC DRAIN (DRAIN) IMPLANT
EVACUATOR SILICONE 100CC (DRAIN) IMPLANT
FORCEPS BIPOLAR SPETZLER 8 1.0 (NEUROSURGERY SUPPLIES) ×3 IMPLANT
GAUZE 4X4 16PLY ~~LOC~~+RFID DBL (SPONGE) IMPLANT
GAUZE SPONGE 4X4 12PLY STRL (GAUZE/BANDAGES/DRESSINGS) IMPLANT
GLOVE EXAM NITRILE LRG STRL (GLOVE) IMPLANT
GLOVE EXAM NITRILE XS STR PU (GLOVE) IMPLANT
GLOVE SURG ENC MOIS LTX SZ7 (GLOVE) IMPLANT
GLOVE SURG LTX SZ7.5 (GLOVE) ×3 IMPLANT
GLOVE SURG UNDER POLY LF SZ7 (GLOVE) IMPLANT
GLOVE SURG UNDER POLY LF SZ7.5 (GLOVE) ×3 IMPLANT
GOWN STRL REUS W/ TWL LRG LVL3 (GOWN DISPOSABLE) ×4 IMPLANT
GOWN STRL REUS W/ TWL XL LVL3 (GOWN DISPOSABLE) IMPLANT
GOWN STRL REUS W/TWL 2XL LVL3 (GOWN DISPOSABLE) IMPLANT
GOWN STRL REUS W/TWL LRG LVL3 (GOWN DISPOSABLE) ×6
GOWN STRL REUS W/TWL XL LVL3 (GOWN DISPOSABLE)
HEMOSTAT POWDER KIT SURGIFOAM (HEMOSTASIS) ×3 IMPLANT
HEMOSTAT SURGICEL 2X14 (HEMOSTASIS) ×3 IMPLANT
IV NS 1000ML (IV SOLUTION) ×3
IV NS 1000ML BAXH (IV SOLUTION) ×2 IMPLANT
KIT BASIN OR (CUSTOM PROCEDURE TRAY) ×3 IMPLANT
KIT DRAIN CSF ACCUDRAIN (MISCELLANEOUS) IMPLANT
KIT TURNOVER KIT B (KITS) ×3 IMPLANT
MESH DYNAMIC STD LG 1.7X120 (Mesh General) ×2 IMPLANT
NDL SPNL 18GX3.5 QUINCKE PK (NEEDLE) IMPLANT
NEEDLE HYPO 22GX1.5 SAFETY (NEEDLE) ×3 IMPLANT
NEEDLE SPNL 18GX3.5 QUINCKE PK (NEEDLE) IMPLANT
NS IRRIG 1000ML POUR BTL (IV SOLUTION) ×9 IMPLANT
PACK BATTERY CMF DISP FOR DVR (ORTHOPEDIC DISPOSABLE SUPPLIES) ×2 IMPLANT
PACK CRANIOTOMY CUSTOM (CUSTOM PROCEDURE TRAY) ×3 IMPLANT
PAD DRESSING TELFA 3X8 NADH (GAUZE/BANDAGES/DRESSINGS) IMPLANT
PATTIES SURGICAL .25X.25 (GAUZE/BANDAGES/DRESSINGS) IMPLANT
PATTIES SURGICAL .5 X.5 (GAUZE/BANDAGES/DRESSINGS) IMPLANT
PATTIES SURGICAL .5 X3 (DISPOSABLE) IMPLANT
PATTIES SURGICAL 1/4 X 3 (GAUZE/BANDAGES/DRESSINGS) IMPLANT
PATTIES SURGICAL 1X1 (DISPOSABLE) IMPLANT
PIN MAYFIELD SKULL DISP (PIN) ×3 IMPLANT
PLATE BONE 12 2H TARGET XL (Plate) ×4 IMPLANT
SCREW UNIII AXS SD 1.5X4 (Screw) ×40 IMPLANT
SPONGE NEURO XRAY DETECT 1X3 (DISPOSABLE) IMPLANT
SPONGE SURGIFOAM ABS GEL 100 (HEMOSTASIS) ×3 IMPLANT
STAPLER VISISTAT 35W (STAPLE) ×3 IMPLANT
SUT ETHILON 3 0 FSL (SUTURE) IMPLANT
SUT ETHILON 3 0 PS 1 (SUTURE) IMPLANT
SUT MNCRL AB 3-0 PS2 18 (SUTURE) ×2 IMPLANT
SUT MON AB 3-0 SH 27 (SUTURE)
SUT MON AB 3-0 SH27 (SUTURE) IMPLANT
SUT NURALON 4 0 TR CR/8 (SUTURE) ×5 IMPLANT
SUT VIC AB 2-0 CP2 18 (SUTURE) ×3 IMPLANT
TOWEL GREEN STERILE (TOWEL DISPOSABLE) ×3 IMPLANT
TOWEL GREEN STERILE FF (TOWEL DISPOSABLE) ×3 IMPLANT
TRAY FOLEY MTR SLVR 16FR STAT (SET/KITS/TRAYS/PACK) ×3 IMPLANT
TUBE CONNECTING 12X1/4 (SUCTIONS) ×3 IMPLANT
UNDERPAD 30X36 HEAVY ABSORB (UNDERPADS AND DIAPERS) ×3 IMPLANT
WATER STERILE IRR 1000ML POUR (IV SOLUTION) ×3 IMPLANT

## 2021-06-15 NOTE — Anesthesia Procedure Notes (Signed)
Arterial Line Insertion ?Start/End3/12/2021 7:00 AM, 06/15/2021 7:10 AM ?Performed by: Oleta Mouse, MD, Griffin Dakin, CRNA, CRNA ? Patient location: Pre-op. ?Preanesthetic checklist: patient identified, IV checked, site marked, risks and benefits discussed, surgical consent, monitors and equipment checked, pre-op evaluation, timeout performed and anesthesia consent ?Lidocaine 1% used for infiltration ?Left, radial was placed ?Catheter size: 20 G ?Hand hygiene performed  and maximum sterile barriers used  ? ?Attempts: 1 ?Procedure performed without using ultrasound guided technique. ?Following insertion, dressing applied and Biopatch. ?Post procedure assessment: normal and unchanged ? ?Patient tolerated the procedure well with no immediate complications. ? ? ?

## 2021-06-15 NOTE — Anesthesia Preprocedure Evaluation (Signed)
Anesthesia Evaluation  ?Patient identified by MRN, date of birth, ID band ?Patient awake ? ? ? ?Reviewed: ?Allergy & Precautions, NPO status , Patient's Chart, lab work & pertinent test results, reviewed documented beta blocker date and time  ? ?History of Anesthesia Complications ?Negative for: history of anesthetic complications ? ?Airway ?Mallampati: II ? ?TM Distance: >3 FB ?Neck ROM: Full ? ? ? Dental ? ?(+) Teeth Intact, Dental Advisory Given ?  ?Pulmonary ?neg pulmonary ROS,  ?  ?breath sounds clear to auscultation ? ? ? ? ? ? Cardiovascular ?hypertension, Pt. on medications and Pt. on home beta blockers ?(-) angina(-) Past MI and (-) CHF  ?Rhythm:Regular  ? ?  ?Neuro/Psych ? Brain Tumor ?negative psych ROS  ? GI/Hepatic ?negative GI ROS, Neg liver ROS,   ?Endo/Other  ?negative endocrine ROS ? Renal/GU ?negative Renal ROS  ? ?  ?Musculoskeletal ?negative musculoskeletal ROS ?(+)  ? Abdominal ?  ?Peds ? Hematology ?negative hematology ROS ?(+)   ?Anesthesia Other Findings ? ? Reproductive/Obstetrics ? ?  ? ? ? ? ? ? ? ? ? ? ? ? ? ?  ?  ? ? ? ? ? ? ? ? ?Anesthesia Physical ?Anesthesia Plan ? ?ASA: 3 ? ?Anesthesia Plan: General  ? ?Post-op Pain Management: Ofirmev IV (intra-op)*  ? ?Induction: Intravenous ? ?PONV Risk Score and Plan: 3 and Ondansetron and Dexamethasone ? ?Airway Management Planned: Oral ETT ? ?Additional Equipment: None ? ?Intra-op Plan:  ? ?Post-operative Plan: Extubation in OR ? ?Informed Consent: I have reviewed the patients History and Physical, chart, labs and discussed the procedure including the risks, benefits and alternatives for the proposed anesthesia with the patient or authorized representative who has indicated his/her understanding and acceptance.  ? ? ? ?Dental advisory given ? ?Plan Discussed with: CRNA and Anesthesiologist ? ?Anesthesia Plan Comments:   ? ? ? ? ? ? ?Anesthesia Quick Evaluation ? ?

## 2021-06-15 NOTE — Plan of Care (Signed)
?  Problem: Education: ?Goal: Knowledge of General Education information will improve ?Description: Including pain rating scale, medication(s)/side effects and non-pharmacologic comfort measures ?Outcome: Progressing ?  ?Problem: Clinical Measurements: ?Goal: Will remain free from infection ?Outcome: Progressing ?  ?Problem: Pain Managment: ?Goal: General experience of comfort will improve ?Outcome: Progressing ?  ?Problem: Safety: ?Goal: Ability to remain free from injury will improve ?Outcome: Progressing ?  ?Problem: Skin Integrity: ?Goal: Risk for impaired skin integrity will decrease ?Outcome: Progressing ?  ?Problem: Nutrition: ?Goal: Adequate nutrition will be maintained ?Outcome: Completed/Met ?  ?

## 2021-06-15 NOTE — Transfer of Care (Signed)
Immediate Anesthesia Transfer of Care Note ? ?Patient: Nicole Lyons ? ?Procedure(s) Performed: LEFT CRANIOTOMY FOR TUMOR EXCISION WITH BRAIN LAB (Left: Head) ?APPLICATION OF CRANIAL NAVIGATION (Left: Head) ? ?Patient Location: PACU ? ?Anesthesia Type:General ? ?Level of Consciousness: awake, alert  and oriented ? ?Airway & Oxygen Therapy: Patient Spontanous Breathing and Patient connected to face mask oxygen ? ?Post-op Assessment: Report given to RN and Post -op Vital signs reviewed and stable ? ?Post vital signs: Reviewed and stable ? ?Last Vitals:  ?Vitals Value Taken Time  ?BP 144/94 06/15/21 1051  ?Temp    ?Pulse 87 06/15/21 1057  ?Resp 17 06/15/21 1057  ?SpO2 99 % 06/15/21 1057  ?Vitals shown include unvalidated device data. ? ?Last Pain:  ?Vitals:  ? 06/15/21 0305  ?TempSrc: Oral  ?PainSc:   ?   ? ?  ? ?Complications: No notable events documented. ?

## 2021-06-15 NOTE — Anesthesia Postprocedure Evaluation (Signed)
Anesthesia Post Note ? ?Patient: KEVINA PILOTO ? ?Procedure(s) Performed: LEFT CRANIOTOMY FOR TUMOR EXCISION WITH BRAIN LAB (Left: Head) ?APPLICATION OF CRANIAL NAVIGATION (Left: Head) ? ?  ? ?Patient location during evaluation: PACU ?Anesthesia Type: General ?Level of consciousness: awake and alert ?Pain management: pain level controlled ?Vital Signs Assessment: post-procedure vital signs reviewed and stable ?Respiratory status: spontaneous breathing, nonlabored ventilation, respiratory function stable and patient connected to nasal cannula oxygen ?Cardiovascular status: blood pressure returned to baseline and stable ?Postop Assessment: no apparent nausea or vomiting ?Anesthetic complications: no ? ? ?No notable events documented. ? ?Last Vitals:  ?Vitals:  ? 06/15/21 1500 06/15/21 1600  ?BP: (!) 166/112 (!) 145/101  ?Pulse: (!) 106 (!) 102  ?Resp: 17 18  ?Temp:  36.9 ?C  ?SpO2: 97% 96%  ?  ?Last Pain:  ?Vitals:  ? 06/15/21 1600  ?TempSrc: Oral  ?PainSc: 7   ? ? ?  ?  ?  ?  ?  ?  ? ?Courtnie Brenes ? ? ? ? ?

## 2021-06-15 NOTE — Progress Notes (Signed)
Report given to Otho Najjar CRNA  ?

## 2021-06-15 NOTE — Anesthesia Procedure Notes (Signed)
Procedure Name: Intubation ?Date/Time: 06/15/2021 7:55 AM ?Performed by: Oleta Mouse, MD ?Pre-anesthesia Checklist: Patient identified, Emergency Drugs available, Suction available and Patient being monitored ?Patient Re-evaluated:Patient Re-evaluated prior to induction ?Oxygen Delivery Method: Circle system utilized ?Preoxygenation: Pre-oxygenation with 100% oxygen ?Induction Type: IV induction ?Ventilation: Mask ventilation without difficulty ?Laryngoscope Size: Mac and 4 ?Grade View: Grade I ?Tube type: Oral ?Tube size: 7.0 mm ?Number of attempts: 1 ?Airway Equipment and Method: Stylet and Oral airway ?Placement Confirmation: ETT inserted through vocal cords under direct vision, positive ETCO2 and breath sounds checked- equal and bilateral ?Secured at: 23 cm ?Tube secured with: Tape ?Dental Injury: Teeth and Oropharynx as per pre-operative assessment  ? ? ? ? ?

## 2021-06-15 NOTE — Progress Notes (Incomplete)
Belongings on admission to 4[facility] ICU: ?Backpack ?Glasses ?Cell phone ?Cell phone charger ?Shoes ?Bra ?Shirt ?

## 2021-06-15 NOTE — Progress Notes (Signed)
Neurosurgery Service ?Progress Note ? ?Subjective: No acute events overnight, continued headaches, no new symptoms overnight  ? ?Objective: ?Vitals:  ? 06/14/21 2211 06/14/21 2346 06/15/21 0305 06/15/21 0705  ?BP: (!) 142/92 (!) 112/94 124/88   ?Pulse: 95 77 74   ?Resp:  13 15   ?Temp:  98.5 ?F (36.9 ?C) 98.6 ?F (37 ?C)   ?TempSrc:  Oral Oral   ?SpO2:  97% 93%   ?Weight:    93 kg  ? ? ?Physical Exam: ?Aox3, PERRL, EOMI, FS & SS, strength 5/5x4, SILTx4 ? ?Assessment & Plan: ?56 y.o. woman w/ progressive headaches, workup with large left extra-axial posterior fossa mass. ? ?-OR today for resection ?-4[facility] ICU post-op ? ?Nicole Lyons  ?06/15/21 ?7:39 AM ? ?

## 2021-06-15 NOTE — Op Note (Signed)
PATIENT: Nicole Lyons ? ?DAY OF SURGERY: 06/15/21 ?  ?PRE-OPERATIVE DIAGNOSIS:  Brain tumor ?  ?POST-OPERATIVE DIAGNOSIS:  Same ?  ?PROCEDURE: Left craniotomy for tumor resection, use of frameless stereotaxy, use of operating microscope, left titanium mesh cranioplasty ?  ?SURGEON:  Surgeon(s) and Role: ?   Judith Part, MD - Primary ?  ?ANESTHESIA: ETGA ?  ?BRIEF HISTORY: This is a 56 year old woman who presented with progressive headaches. 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 This was discussed with the patient as well as risks, benefits, and alternatives and wished to proceed with surgery. ?  ?OPERATIVE DETAIL: The patient was taken to the operating room, anesthesia was induced by the anesthesia team, the [place] head holder was applied, and the patient was placed on the OR table in the prone position. A formal time out was performed with two patient identifiers and confirmed the operative site. A registration array was attached to the [place]. This was co-registered with the patient's preoperative imaging, the fit appeared to be acceptable. Using frameless stereotaxy, the operative trajectory was planned and the incision was marked. Hair was clipped with surgical clippers over the incision and the area was then prepped and draped in a sterile fashion. ? ?A linear incision was placed in the left retrosigmoid region in a paramedian location. The mass was dissected circumeferentially, as expected the bone was quite thin over the lesion with some bony defects that were dissected bluntly. A superficial piece was sent for frozen, it was avascular and appeared consistent with typical epidermoid material. A craniotomy was then performed and connected to the bony defects. The mass was dissected free and resected then sent for permanent. The inner layer of the dermoid wall was impressively adherent to cerebellum and,  given the slow rate of tumor growth and the risks of resection, I chose to leave that portion intact.  ? ?Given the osteolytic nature of the tumor, there was a large cranial defect after resection. I used the intact portion of the bone and secured this back to the skull with titanium hardware. I then used a piece of titanium mesh inferior to that to help cover the defect and secured this with titanium hardware as well.  ? ?All instrument and sponge counts were correct, the incision was then closed in layers. The patient was then returned to anesthesia for emergence. No apparent complications at the completion of the procedure. ?  ?EBL:  17m ?  ?DRAINS: none ?  ?SPECIMENS: Left posterior fossa tumor ?  ?TJudith Part MD ?06/15/21 ?7:41 AM ? ?

## 2021-06-16 ENCOUNTER — Encounter (HOSPITAL_COMMUNITY): Payer: Self-pay | Admitting: Neurological Surgery

## 2021-06-16 LAB — SURGICAL PATHOLOGY

## 2021-06-16 MED ORDER — METHOCARBAMOL 500 MG PO TABS
500.0000 mg | ORAL_TABLET | Freq: Four times a day (QID) | ORAL | Status: DC | PRN
  Administered 2021-06-17: 500 mg via ORAL
  Filled 2021-06-16: qty 1

## 2021-06-16 MED ORDER — SODIUM CHLORIDE 0.9 % IV SOLN
INTRAVENOUS | Status: DC | PRN
Start: 2021-06-16 — End: 2021-06-17

## 2021-06-16 MED FILL — Thrombin For Soln 5000 Unit: CUTANEOUS | Qty: 5000 | Status: AC

## 2021-06-16 NOTE — Evaluation (Signed)
Physical Therapy Evaluation ?Patient Details ?Name: Nicole Lyons ?MRN: [id number] ?DOB: 09/15/1965 ?Today's Date: 06/16/2021 ? ?History of Present Illness ? 56 y.o. female presents to [facility] hospital on 06/14/2021 with persistent headache. CT head demonstrates a large left-sided posterior fossa tumor. Pt underwent L craniotomy with tumor resection on 3/9. PMH includes HTN and HLD.  ?Clinical Impression ? Pt presents to PT with deficits in gait and balance. Pt with tendency to drift to right side when mobilizing, more significant drift noted with dynamic gait tasks. Pt will benefit from aggressive mobilization and PT services in an effort to improve balance and restore independence.   ?   ? ?Recommendations for follow up therapy are one component of a multi-disciplinary discharge planning process, led by the attending physician.  Recommendations may be updated based on patient status, additional functional criteria and insurance authorization. ? ?Follow Up Recommendations Outpatient PT ? ?  ?Assistance Recommended at Discharge PRN  ?Patient can return home with the following ? A little help with walking and/or transfers;A little help with bathing/dressing/bathroom;Help with stairs or ramp for entrance;Assist for transportation ? ?  ?Equipment Recommendations None recommended by PT  ?Recommendations for Other Services ?    ?  ?Functional Status Assessment Patient has had a recent decline in their functional status and demonstrates the ability to make significant improvements in function in a reasonable and predictable amount of time.  ? ?  ?Precautions / Restrictions Precautions ?Precautions: Fall ?Restrictions ?Weight Bearing Restrictions: No  ? ?  ? ?Mobility ? Bed Mobility ?  ?  ?  ?  ?  ?  ?  ?  ?  ? ?Transfers ?Overall transfer level: Independent ?  ?  ?  ?  ?  ?  ?  ?  ?  ?  ? ?Ambulation/Gait ?Ambulation/Gait assistance: Supervision ?Gait Distance (Feet): 1000 Feet ?Assistive device: None ?Gait Pattern/deviations:  Step-through pattern, Drifts right/left ?Gait velocity: functional ?Gait velocity interpretation: >2.62 ft/sec, indicative of community ambulatory ?  ?General Gait Details: pt with tendency to drift to right when PT guarding on left side, no leftward drift with PT guarding on R side. Pt is aware of right lateral drift tendency. Pt is able to change gait speed, stop abruptly, walk backward, change stride length, and perform head turns with mild drift. ? ?Stairs ?Stairs: Yes ?Stairs assistance: Min guard ?Stair Management: No rails, Forwards, Alternating pattern ?Number of Stairs: 10 ?  ? ?Wheelchair Mobility ?  ? ?Modified Rankin (Stroke Patients Only) ?  ? ?  ? ?Balance Overall balance assessment: Needs assistance ?Sitting-balance support: No upper extremity supported, Feet supported ?Sitting balance-Leahy Scale: Good ?  ?  ?Standing balance support: No upper extremity supported, During functional activity ?Standing balance-Leahy Scale: Good ?  ?  ?  ?  ?  ?  ?  ?  ?  ?  ?  ?  ?   ? ? ? ?Pertinent Vitals/Pain Pain Assessment ?Pain Assessment: Faces ?Faces Pain Scale: Hurts little more ?Pain Location: neck ?Pain Descriptors / Indicators: Grimacing ?Pain Intervention(s): Monitored during session  ? ? ?Home Living Family/patient expects to be discharged to:: Private residence ?Living Arrangements: Spouse/significant other ?Available Help at Discharge: Family;Available PRN/intermittently ?Type of Home: House ?Home Access: Stairs to enter ?Entrance Stairs-Rails: None ?Entrance Stairs-Number of Steps: 3-4 ?  ?Home Layout: One level ?Home Equipment: None ?   ?  ?Prior Function Prior Level of Function : Independent/Modified Independent;Working/employed ?  ?  ?  ?  ?  ?  ?  ?  ?  ? ? ?  Hand Dominance  ? Dominant Hand: Right ? ?  ?Extremity/Trunk Assessment  ? Upper Extremity Assessment ?Upper Extremity Assessment: Overall WFL for tasks assessed ?  ? ?Lower Extremity Assessment ?Lower Extremity Assessment: Overall WFL for  tasks assessed ?  ? ?Cervical / Trunk Assessment ?Cervical / Trunk Assessment: Other exceptions (s/p craniotomy)  ?Communication  ? Communication: No difficulties  ?Cognition Arousal/Alertness: Awake/alert ?Behavior During Therapy: [facility] for tasks assessed/performed ?Overall Cognitive Status: Within Functional Limits for tasks assessed ?  ?  ?  ?  ?  ?  ?  ?  ?  ?  ?  ?  ?  ?  ?  ?  ?  ?  ?  ? ?  ?General Comments General comments (skin integrity, edema, etc.): VSS on RA ? ?  ?Exercises    ? ?Assessment/Plan  ?  ?PT Assessment Patient needs continued PT services  ?PT Problem List Decreased balance;Decreased mobility ? ?   ?  ?PT Treatment Interventions Gait training;Stair training;Balance training;Neuromuscular re-education;Patient/family education   ? ?PT Goals (Current goals can be found in the Care Plan section)  ?Acute Rehab PT Goals ?Patient Stated Goal: to return to independence ?PT Goal Formulation: With patient ?Time For Goal Achievement: 06/30/21 ?Potential to Achieve Goals: Good ?Additional Goals ?Additional Goal #1: Pt will score >19/24 on DGI to indicate a reduced risk for falls ? ?  ?Frequency Min 3X/week ?  ? ? ?Co-evaluation   ?  ?  ?  ?  ? ? ?  ?AM-PAC PT "6 Clicks" Mobility  ?Outcome Measure Help needed turning from your back to your side while in a flat bed without using bedrails?: None ?Help needed moving from lying on your back to sitting on the side of a flat bed without using bedrails?: None ?Help needed moving to and from a bed to a chair (including a wheelchair)?: None ?Help needed standing up from a chair using your arms (e.g., wheelchair or bedside chair)?: None ?Help needed to walk in hospital room?: A Little ?Help needed climbing 3-5 steps with a railing? : A Little ?6 Click Score: 22 ? ?  ?End of Session   ?Activity Tolerance: Patient tolerated treatment well ?Patient left: in chair;with call bell/phone within reach;with chair alarm set ?Nurse Communication: Mobility status ?PT Visit  Diagnosis: Other abnormalities of gait and mobility (R26.89);Other symptoms and signs involving the nervous system (R29.898) ?  ? ?Time: 7793-9030 ?PT Time Calculation (min) (ACUTE ONLY): 21 min ? ? ?Charges:   PT Evaluation ?$PT Eval Low Complexity: 1 Low ?  ?  ?   ? ? ?Zenaida Niece, PT, DPT ?Acute Rehabilitation ?Pager: [phone] ?Office [phone] ? ? ?Zenaida Niece ?06/16/2021, 12:26 PM ? ?

## 2021-06-16 NOTE — Plan of Care (Signed)
?  Problem: Education: ?Goal: Knowledge of General Education information will improve ?Description: Including pain rating scale, medication(s)/side effects and non-pharmacologic comfort measures ?Outcome: Progressing ?  ?Problem: Education: ?Goal: Knowledge of General Education information will improve ?Description: Including pain rating scale, medication(s)/side effects and non-pharmacologic comfort measures ?Outcome: Progressing ?  ?Problem: Health Behavior/Discharge Planning: ?Goal: Ability to manage health-related needs will improve ?Outcome: Progressing ?  ?Problem: Clinical Measurements: ?Goal: Ability to maintain clinical measurements within normal limits will improve ?06/16/2021 1708 by Claude Manges, RN ?Outcome: Progressing ?06/16/2021 1706 by Claude Manges, RN ?Outcome: Progressing ?  ?Problem: Clinical Measurements: ?Goal: Respiratory complications will improve ?06/16/2021 1708 by Claude Manges, RN ?Outcome: Progressing ?06/16/2021 1706 by Claude Manges, RN ?Outcome: Adequate for Discharge ?  ?Problem: Activity: ?Goal: Risk for activity intolerance will decrease ?06/16/2021 1708 by Claude Manges, RN ?Outcome: Progressing ?06/16/2021 1706 by Claude Manges, RN ?Outcome: Progressing ?  ?Problem: Clinical Measurements: ?Goal: Cardiovascular complication will be avoided ?06/16/2021 1708 by Claude Manges, RN ?Outcome: Adequate for Discharge ?06/16/2021 1706 by Claude Manges, RN ?Outcome: Adequate for Discharge ?  ?Problem: Coping: ?Goal: Level of anxiety will decrease ?06/16/2021 1708 by Claude Manges, RN ?Outcome: Adequate for Discharge ?06/16/2021 1706 by Claude Manges, RN ?Outcome: Adequate for Discharge ?  ?Problem: Elimination: ?Goal: Will not experience complications related to urinary retention ?06/16/2021 1708 by Claude Manges, RN ?Outcome: Adequate for Discharge ?06/16/2021 1706 by Claude Manges,  RN ?Outcome: Adequate for Discharge ?  ?Problem: Skin Integrity: ?Goal: Risk for impaired skin integrity will decrease ?Outcome: Adequate for Discharge ?  ?

## 2021-06-16 NOTE — Evaluation (Signed)
Occupational Therapy Evaluation and Discharge ?Patient Details ?Name: Nicole Lyons ?MRN: [id number] ?DOB: 29-Mar-1966 ?Today's Date: 06/16/2021 ? ? ?History of Present Illness Nicole Lyons is a 56 year old female presented to the [facility] Emergency Department on 06/14/2021 with a complaint of persistent headaches. 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 3/9--Left craniotomy for tumor resection and left titanium mesh cranioplasty.  ? ?Clinical Impression ?  ?This 56 yo female admitted with above presents to acute OT at an overall intermittent S level for higher level balance tasks (WU:[id number] into and out of tub) and will have this from her family. No further OT needs, we will sign off.  ?   ? ?Recommendations for follow up therapy are one component of a multi-disciplinary discharge planning process, led by the attending physician.  Recommendations may be updated based on patient status, additional functional criteria and insurance authorization.  ? ?Follow Up Recommendations ? No OT follow up  ?  ?Assistance Recommended at Discharge Intermittent Supervision/Assistance  ?Patient can return home with the following Assistance with cooking/housework;Assist for transportation ? ?  ?Functional Status Assessment ? Patient has had a recent decline in their functional status and demonstrates the ability to make significant improvements in function in a reasonable and predictable amount of time. (but does not need further skilled OT)  ?Equipment Recommendations ? None recommended by OT  ?  ?   ?Precautions / Restrictions Precautions ?Precautions: Fall ?Restrictions ?Weight Bearing Restrictions: No  ? ?  ? ?Mobility Bed Mobility ?Overal bed mobility: Independent ?  ?  ?  ?  ?  ?  ?  ?  ? ?Transfers ?Overall transfer level: Needs assistance ?Equipment used: None ?Transfers: Sit to/from Stand, Bed to chair/wheelchair/BSC ?Sit to Stand: Supervision ?  ?  ?  ?  ?  ?General transfer comment:  ambulation around in the room pt did have 2 LOB (that she self corrected). One was while standing in bathroom she lost her balance backwards where floor slopes and the other time while turning to sit in recliner. ?  ? ?  ?Balance Overall balance assessment: Mild deficits observed, not formally tested ?  ?  ?  ?  ?  ?  ?  ?  ?  ?  ?  ?  ?  ?  ?  ?  ?  ?  ?   ? ?ADL either performed or assessed with clinical judgement  ? ?ADL Overall ADL's : Independent ?  ?  ?  ?  ?  ?  ?  ?  ?  ?  ?  ?  ?  ?  ?  ?  ?  ?  ?  ?General ADL Comments: Did speak with her and her husband about how it would be more safe for her sit on a seat in shower (I recommended the carex tub seat with a back)--they may get one on their own. Also adivsed her to take her time, move slower for balance purposes.  ? ? ? ?Vision Patient Visual Report: No change from baseline ?   ?   ?   ?   ? ?Pertinent Vitals/Pain Pain Assessment ?Pain Assessment: Faces ?Faces Pain Scale: Hurts even more ?Pain Location: neck ?Pain Descriptors / Indicators:  (stiffiness) ?Pain Intervention(s): Limited activity within patient's tolerance, Repositioned (RN was waiting on robaxin)  ? ? ? ?Hand Dominance Right ?  ?Extremity/Trunk Assessment Upper Extremity Assessment ?Upper Extremity Assessment: Overall WFL for tasks assessed ?  ?  ?  ?  ?  ?  Communication Communication ?Communication: No difficulties ?  ?Cognition Arousal/Alertness: Awake/alert ?Behavior During Therapy: [facility] for tasks assessed/performed ?Overall Cognitive Status: Within Functional Limits for tasks assessed ?  ?  ?  ?  ?  ?  ?  ?  ?  ?  ?  ?  ?  ?  ?  ?  ?General Comments: Did have trouble recalling her oldest dtr's major ?  ?  ?   ?   ?   ? ? ?Home Living Family/patient expects to be discharged to:: Private residence ?Living Arrangements: Spouse/significant other ?Available Help at Discharge: Family;Available PRN/intermittently ?Type of Home: House ?Home Access: Stairs to enter ?Entrance Stairs-Number of Steps:  3-4 ?Entrance Stairs-Rails: None ?Home Layout: One level ?  ?  ?Bathroom Shower/Tub: Tub/shower unit;Curtain ?  ?Bathroom Toilet: Handicapped height ?  ?  ?Home Equipment: None ?  ?  ?  ? ?  ?Prior Functioning/Environment Prior Level of Function : Independent/Modified Independent;Working/employed;Driving ?  ?  ?  ?  ?  ?  ?  ?  ?  ? ?  ?  ?OT Problem List: Decreased range of motion;Impaired balance (sitting and/or standing) ?  ?   ?   ?OT Goals(Current goals can be found in the care plan section) Acute Rehab OT Goals ?Patient Stated Goal: to get back home soon  ?   ? ?   ?AM-PAC OT "6 Clicks" Daily Activity     ?Outcome Measure Help from another person eating meals?: None ?Help from another person taking care of personal grooming?: None ?Help from another person toileting, which includes using toliet, bedpan, or urinal?: None ?Help from another person bathing (including washing, rinsing, drying)?: A Little (S getting in and out of tub) ?Help from another person to put on and taking off regular upper body clothing?: None ?Help from another person to put on and taking off regular lower body clothing?: None ?6 Click Score: 23 ?  ?End of Session Nurse Communication:  (the 2 LOB she had with me that she self corrected) ? ?Activity Tolerance: Patient tolerated treatment well ?Patient left: in chair;with call bell/phone within reach;with chair alarm set ? ?OT Visit Diagnosis: Unsteadiness on feet (R26.81);Pain ?Pain - part of body:  (neck)  ?              ?Time: 6629-4765 ?OT Time Calculation (min): 22 min ?Charges:  OT General Charges ?$OT Visit: 1 Visit ?OT Evaluation ?$OT Eval Moderate Complexity: 1 Mod ? ?[place]en [place], OTR/L ?Acute Rehab Services ?Pager [phone] ?Office [phone] ? ? ? ?Almon Register ?06/16/2021, 11:44 AM ?

## 2021-06-16 NOTE — Plan of Care (Signed)
?  Problem: Education: ?Goal: Knowledge of General Education information will improve ?Description: Including pain rating scale, medication(s)/side effects and non-pharmacologic comfort measures ?Outcome: Progressing ?  ?Problem: Health Behavior/Discharge Planning: ?Goal: Ability to manage health-related needs will improve ?Outcome: Progressing ?  ?Problem: Clinical Measurements: ?Goal: Ability to maintain clinical measurements within normal limits will improve ?Outcome: Progressing ?  ?Problem: Activity: ?Goal: Risk for activity intolerance will decrease ?Outcome: Progressing ?  ?Problem: Coping: ?Goal: Level of anxiety will decrease ?Outcome: Progressing ?  ?Problem: Pain Managment: ?Goal: General experience of comfort will improve ?Outcome: Progressing ?  ?

## 2021-06-16 NOTE — Progress Notes (Signed)
?  Transition of Care (TOC) Screening Note ? ? ?Patient Details  ?Name: Nicole Lyons ?Date of Birth: 1966-03-21 ? ? ?Transition of Care [facility]) CM/SW Contact:    ?Ella Bodo, RN ?Phone Number: ?06/16/2021, 4:42 PM ? ? ? ?Transition of Care Department [facility]) has reviewed patient and no TOC needs have been identified at this time. We will continue to monitor patient advancement through interdisciplinary progression rounds. If new patient transition needs arise, please place a TOC consult. ? ?Reinaldo Raddle, RN, BSN  ?Trauma/Neuro ICU Case Manager ?[phone] ? ?

## 2021-06-16 NOTE — Progress Notes (Signed)
Pt w/ old blood on her bottom sheet. She requested to have this changed out later in this shift.  ?

## 2021-06-16 NOTE — Progress Notes (Signed)
Neurosurgery Service ?Progress Note ? ?Subjective: No acute events overnight, some incisional pain but otherwise feels great  ? ?Objective: ?Vitals:  ? 06/16/21 0700 06/16/21 0800 06/16/21 0900 06/16/21 0902  ?BP: (!) 158/42 (!) 147/99 (!) 125/103 (!) 125/103  ?Pulse: 88 93 (!) 102 100  ?Resp: '17 15 18   '$ ?Temp:      ?TempSrc:      ?SpO2: 100% 95% 93%   ?Weight:      ? ? ?Physical Exam: ?Aox3, PERRL, EOMI, FS & SS, TM, strength 5/5x4, SILTx4, incision c/d/i ? ?Assessment & Plan: ?56 y.o. 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 ? ?-PT/OT today ?-d/c foley / A-line / activity as tolerated, can shower ?-possible discharge this afternoon if she's feeling up to it, otherwise tomorrow ? ?Nicole Lyons  ?06/16/21 ?9:21 AM ? ?

## 2021-06-17 MED ORDER — HYDROCODONE-ACETAMINOPHEN 5-325 MG PO TABS: 1.0000 | ORAL_TABLET | ORAL | 0 refills | Status: AC | PRN

## 2021-06-17 MED ORDER — METHOCARBAMOL 500 MG PO TABS: 500.0000 mg | ORAL_TABLET | Freq: Four times a day (QID) | ORAL | 0 refills | Status: AC | PRN

## 2021-06-17 NOTE — Discharge Summary (Signed)
Physician Discharge Summary  ?Patient ID: ?Nicole Lyons ?MRN: [id number] ?DOB/AGE: 01-04-1966 56 y.o. ? ?Admit date: 06/14/2021 ?Discharge date: 06/17/2021 ? ?Admission Diagnoses: ? ?Discharge Diagnoses:  ?Principal Problem: ?  Brain lesion ?Active Problems: ?  Brain tumor ([place]) ? ? ?Discharged Condition: good ? ?Hospital Course: Patient admitted to the hospital where she underwent uncomplicated posterior fossa craniotomy and resection of epidermoid.  Postoperative doing very well.  Patient not having any symptoms of headache.  She is having no cranial neuropathy.  Her balance is good.  She is standing ambulating and voiding without difficulty.  Her wound is healing well without evidence of CSF leak.  She feels ready for discharge home. ? ?Consults:  ? ?Significant Diagnostic Studies:  ? ?Treatments:  ? ?Discharge Exam: ?Blood pressure (!) 135/94, pulse 92, temperature 98.4 ?F (36.9 ?C), temperature source Oral, resp. rate 20, weight 93 kg, SpO2 93 %. ?Awake and alert.  Oriented and appropriate.  Motor and sensory function intact.  Cranial nerve function normal by.  Motor examination intact bilaterally sensory examination normal.  Wound clean and dry.  Incision flat. ? ?Disposition: Discharge disposition: 01-Home or Self Care ? ? ? ? ? ? ? ?Allergies as of 06/17/2021   ? ?   Reactions  ? Monistat [miconazole] Other (See Comments)  ? Vaginal irritation (bright red)  ? Vagisil [anti-itch Vaginal] Other (See Comments)  ? Vaginal irritation (bright red)  ? ?  ? ?  ?Medication List  ?  ? ?TAKE these medications   ? ?atorvastatin 20 MG tablet ?Commonly known as: LIPITOR ?Take 20 mg by mouth at bedtime. ?  ?cetirizine-pseudoephedrine 5-120 MG tablet ?Commonly known as: ZYRTEC-D ?Take 1 tablet by mouth daily as needed for allergies (sinus headache). ?  ?GOODY HEADACHE PO ?Take 1 packet by mouth daily as needed (headache). ?  ?HYDROcodone-acetaminophen 5-325 MG tablet ?Commonly known as: NORCO/VICODIN ?Take 1 tablet by mouth  every 4 (four) hours as needed for moderate pain. ?  ?methocarbamol 500 MG tablet ?Commonly known as: ROBAXIN ?Take 1 tablet (500 mg total) by mouth every 6 (six) hours as needed for muscle spasms. ?  ?metoprolol tartrate 50 MG tablet ?Commonly known as: LOPRESSOR ?Take 50 mg by mouth 2 (two) times daily. ?  ? ?  ? ? ? ?Signed: ?Cooper Render Briggett Tuccillo ?06/17/2021, 9:56 AM ? ? ?

## 2021-06-17 NOTE — Discharge Instructions (Signed)

## 2021-06-17 NOTE — Plan of Care (Signed)
?  Problem: Education: ?Goal: Knowledge of General Education information will improve ?Description: Including pain rating scale, medication(s)/side effects and non-pharmacologic comfort measures ?Outcome: Adequate for Discharge ?  ?Problem: Health Behavior/Discharge Planning: ?Goal: Ability to manage health-related needs will improve ?Outcome: Adequate for Discharge ?  ?Problem: Clinical Measurements: ?Goal: Ability to maintain clinical measurements within normal limits will improve ?Outcome: Adequate for Discharge ?Goal: Will remain free from infection ?Outcome: Adequate for Discharge ?Goal: Diagnostic test results will improve ?Outcome: Adequate for Discharge ?Goal: Respiratory complications will improve ?Outcome: Adequate for Discharge ?Goal: Cardiovascular complication will be avoided ?Outcome: Adequate for Discharge ?  ?Problem: Activity: ?Goal: Risk for activity intolerance will decrease ?Outcome: Adequate for Discharge ?  ?Problem: Coping: ?Goal: Level of anxiety will decrease ?Outcome: Adequate for Discharge ?  ?Problem: Elimination: ?Goal: Will not experience complications related to bowel motility ?Outcome: Adequate for Discharge ?Goal: Will not experience complications related to urinary retention ?Outcome: Adequate for Discharge ?  ?Problem: Pain Managment: ?Goal: General experience of comfort will improve ?Outcome: Adequate for Discharge ?  ?Problem: Safety: ?Goal: Ability to remain free from injury will improve ?Outcome: Adequate for Discharge ?  ?Problem: Skin Integrity: ?Goal: Risk for impaired skin integrity will decrease ?Outcome: Adequate for Discharge ?  ?Problem: Acute Rehab PT Goals(only PT should resolve) ?Goal: Pt Will Ambulate ?Outcome: Adequate for Discharge ?Goal: Pt Will Go Up/Down Stairs ?Outcome: Adequate for Discharge ?Goal: PT Additional Goal #1 ?Outcome: Adequate for Discharge ?  ?
